# Patient Record
Sex: Male | Born: 1970 | Race: White | Hispanic: No | Marital: Married | Smoking: Never smoker
Health system: Southern US, Community
[De-identification: ages and names within clinical notes are randomized; demographics above are authoritative.]

## PROBLEM LIST (undated history)

## (undated) DIAGNOSIS — F329 Major depressive disorder, single episode, unspecified: Secondary | ICD-10-CM

## (undated) DIAGNOSIS — K219 Gastro-esophageal reflux disease without esophagitis: Secondary | ICD-10-CM

## (undated) DIAGNOSIS — Z8719 Personal history of other diseases of the digestive system: Secondary | ICD-10-CM

## (undated) DIAGNOSIS — M5126 Other intervertebral disc displacement, lumbar region: Secondary | ICD-10-CM

## (undated) DIAGNOSIS — F32A Depression, unspecified: Secondary | ICD-10-CM

## (undated) HISTORY — PX: BACK SURGERY: SHX140

---

## 1994-07-11 HISTORY — PX: CHOLECYSTECTOMY: SHX55

## 2002-07-11 HISTORY — PX: OTHER SURGICAL HISTORY: SHX169

## 2008-08-05 ENCOUNTER — Ambulatory Visit: Payer: Self-pay | Admitting: Occupational Medicine

## 2010-10-12 ENCOUNTER — Other Ambulatory Visit: Payer: Self-pay | Admitting: Specialist

## 2010-10-12 DIAGNOSIS — M5126 Other intervertebral disc displacement, lumbar region: Secondary | ICD-10-CM

## 2010-10-13 ENCOUNTER — Other Ambulatory Visit: Payer: Self-pay

## 2010-10-18 ENCOUNTER — Other Ambulatory Visit: Payer: Self-pay | Admitting: Specialist

## 2010-10-18 ENCOUNTER — Ambulatory Visit (HOSPITAL_COMMUNITY)
Admission: RE | Admit: 2010-10-18 | Discharge: 2010-10-18 | Disposition: A | Discharge status: Home / Self Care | Payer: Private Health Insurance - Indemnity | Source: Ambulatory Visit | Attending: Specialist | Admitting: Specialist

## 2010-10-18 ENCOUNTER — Other Ambulatory Visit (HOSPITAL_COMMUNITY): Payer: Self-pay | Admitting: Specialist

## 2010-10-18 ENCOUNTER — Encounter (HOSPITAL_COMMUNITY): Payer: Private Health Insurance - Indemnity

## 2010-10-18 DIAGNOSIS — Z01812 Encounter for preprocedural laboratory examination: Secondary | ICD-10-CM | POA: Insufficient documentation

## 2010-10-18 DIAGNOSIS — Z01818 Encounter for other preprocedural examination: Secondary | ICD-10-CM | POA: Insufficient documentation

## 2010-10-18 DIAGNOSIS — I498 Other specified cardiac arrhythmias: Secondary | ICD-10-CM | POA: Insufficient documentation

## 2010-10-18 DIAGNOSIS — Z01810 Encounter for preprocedural cardiovascular examination: Secondary | ICD-10-CM | POA: Insufficient documentation

## 2010-10-18 DIAGNOSIS — M47817 Spondylosis without myelopathy or radiculopathy, lumbosacral region: Secondary | ICD-10-CM | POA: Insufficient documentation

## 2010-10-18 LAB — URINALYSIS, ROUTINE W REFLEX MICROSCOPIC
Bilirubin Urine: NEGATIVE
Hgb urine dipstick: NEGATIVE
Nitrite: NEGATIVE
Protein, ur: NEGATIVE mg/dL
Urobilinogen, UA: 0.2 mg/dL (ref 0.0–1.0)

## 2010-10-18 LAB — COMPREHENSIVE METABOLIC PANEL
ALT: 253 U/L — ABNORMAL HIGH (ref 0–53)
AST: 75 U/L — ABNORMAL HIGH (ref 0–37)
Alkaline Phosphatase: 76 U/L (ref 39–117)
CO2: 33 mEq/L — ABNORMAL HIGH (ref 19–32)
Chloride: 101 mEq/L (ref 96–112)
GFR calc Af Amer: >60 mL/min (ref >60)
GFR calc non Af Amer: >60 mL/min (ref >60)
Potassium: 4.4 mEq/L (ref 3.5–5.1)
Sodium: 141 mEq/L (ref 135–145)
Total Bilirubin: 0.6 mg/dL (ref 0.3–1.2)

## 2010-10-18 LAB — CBC
HCT: 49.4 % (ref 39.0–52.0)
MCHC: 34.6 g/dL (ref 30.0–36.0)
MCV: 95 fL (ref 78.0–100.0)
Platelets: 201 10*3/uL (ref 150–400)
RDW: 13 % (ref 11.5–15.5)

## 2010-10-20 ENCOUNTER — Other Ambulatory Visit (HOSPITAL_COMMUNITY): Payer: Self-pay | Admitting: Specialist

## 2010-10-20 ENCOUNTER — Ambulatory Visit (HOSPITAL_COMMUNITY): Payer: Private Health Insurance - Indemnity

## 2010-10-20 ENCOUNTER — Observation Stay (HOSPITAL_COMMUNITY)
Admission: RE | Admit: 2010-10-20 | Discharge: 2010-10-21 | Disposition: A | Discharge status: Home / Self Care | Payer: Private Health Insurance - Indemnity | Source: Ambulatory Visit | Attending: Specialist | Admitting: Specialist

## 2010-10-20 DIAGNOSIS — Z9889 Other specified postprocedural states: Secondary | ICD-10-CM

## 2010-10-20 DIAGNOSIS — M5126 Other intervertebral disc displacement, lumbar region: Principal | ICD-10-CM | POA: Insufficient documentation

## 2010-10-20 DIAGNOSIS — M51379 Other intervertebral disc degeneration, lumbosacral region without mention of lumbar back pain or lower extremity pain: Secondary | ICD-10-CM | POA: Insufficient documentation

## 2010-10-20 DIAGNOSIS — M5137 Other intervertebral disc degeneration, lumbosacral region: Secondary | ICD-10-CM | POA: Insufficient documentation

## 2010-10-20 DIAGNOSIS — Z01811 Encounter for preprocedural respiratory examination: Secondary | ICD-10-CM | POA: Insufficient documentation

## 2010-10-20 DIAGNOSIS — Z01812 Encounter for preprocedural laboratory examination: Secondary | ICD-10-CM | POA: Insufficient documentation

## 2010-10-21 ENCOUNTER — Other Ambulatory Visit: Payer: Self-pay | Admitting: Specialist
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
## 2012-06-09 IMAGING — CR DG CHEST 2V
2 series · 2 of 2 positions shown · non-contrast
Comparison: None.

CLINICAL DATA: Preop.

CHEST - 2 VIEW

[w chest pa]
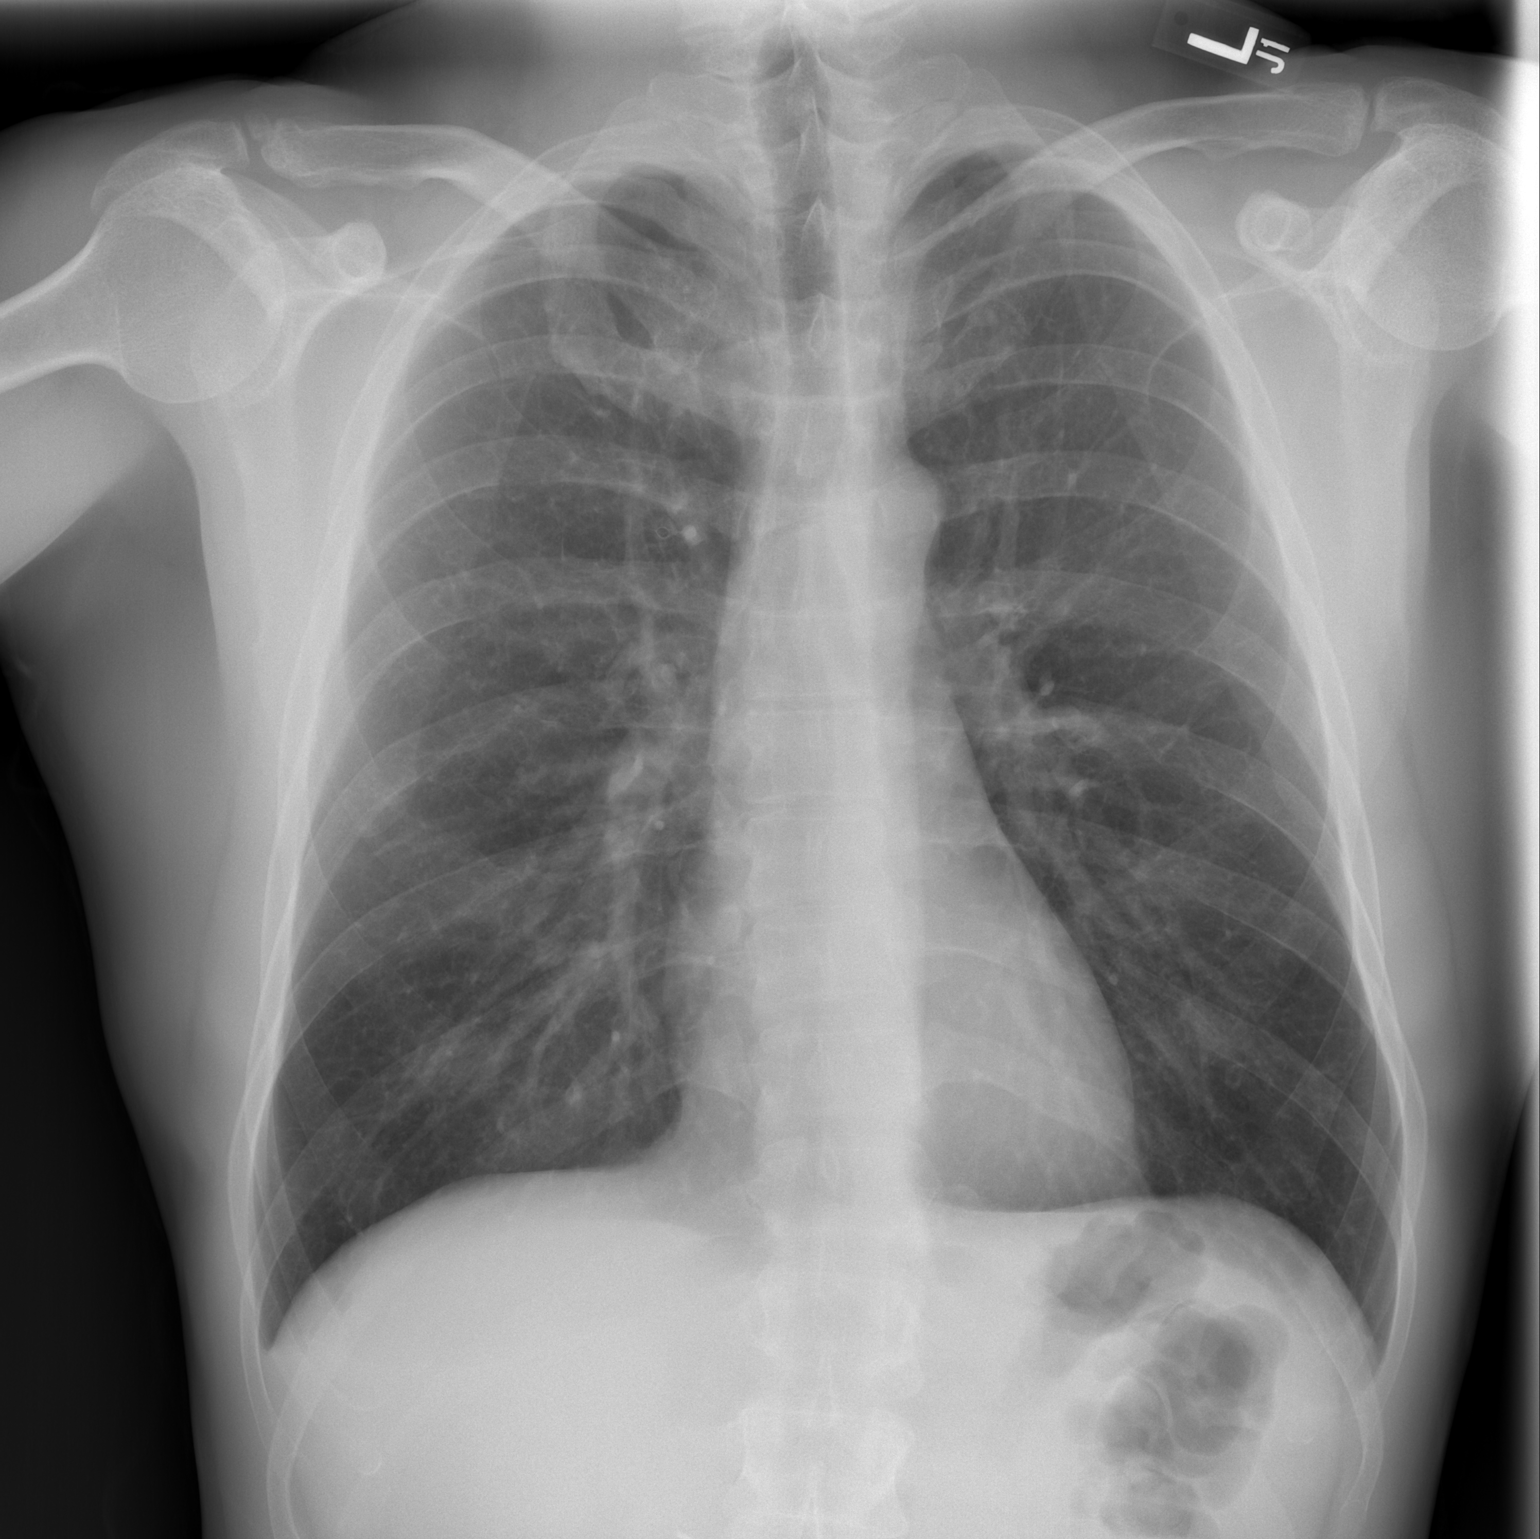

[w chest lat]
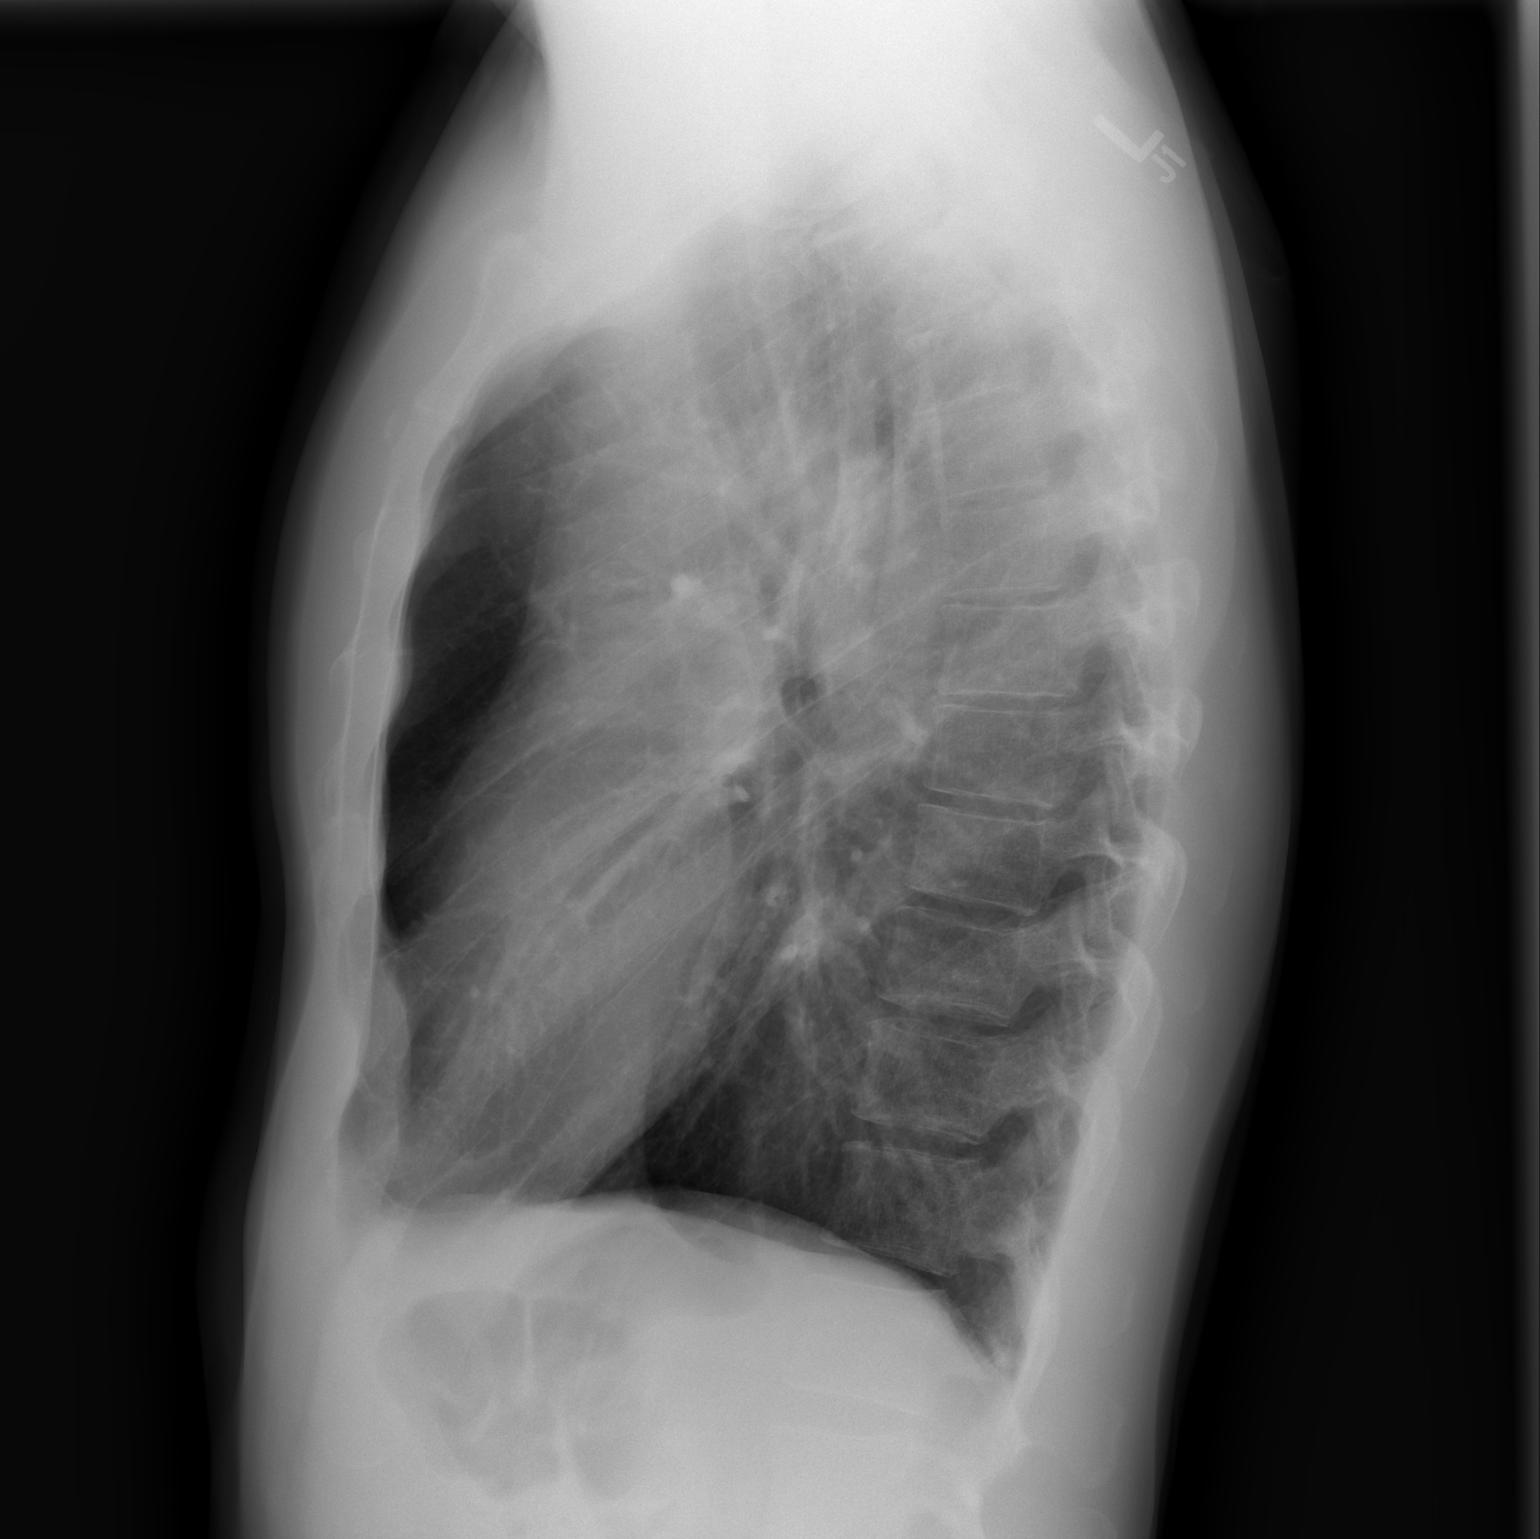

[2 of 2 positions shown; findings below may reference images not displayed]

FINDINGS: Trachea is midline.  Heart size normal. Suspect mild
flattening of the hemidiaphragms with enlargement of the
retrosternal clear space be seen with COPD.  Lungs are clear.  No
pleural fluid. Os acromion is incidentally noted on the right.
IMPRESSION: No acute findings.

## 2012-09-14 NOTE — Progress Notes (Signed)
Surgery scheduled for 09/27/12.  Preop 09/20/12 at 1430.  Need orders in EPIC.  Thanks

## 2012-09-17 ENCOUNTER — Encounter (HOSPITAL_COMMUNITY): Payer: Self-pay | Admitting: Pharmacy Technician

## 2012-09-17 ENCOUNTER — Other Ambulatory Visit: Payer: Self-pay | Admitting: Orthopedic Surgery

## 2012-09-19 ENCOUNTER — Other Ambulatory Visit (HOSPITAL_COMMUNITY): Payer: Self-pay | Admitting: *Deleted

## 2012-09-20 ENCOUNTER — Encounter (HOSPITAL_COMMUNITY): Payer: Self-pay

## 2012-09-20 ENCOUNTER — Encounter (HOSPITAL_COMMUNITY)
Admission: RE | Admit: 2012-09-20 | Discharge: 2012-09-20 | Disposition: A | Discharge status: Home / Self Care | Payer: Federal, State, Local not specified - PPO | Source: Ambulatory Visit | Attending: Specialist | Admitting: Specialist

## 2012-09-20 ENCOUNTER — Ambulatory Visit (HOSPITAL_COMMUNITY)
Admission: RE | Admit: 2012-09-20 | Discharge: 2012-09-20 | Disposition: A | Discharge status: Home / Self Care | Payer: Federal, State, Local not specified - PPO | Source: Ambulatory Visit | Attending: Orthopedic Surgery | Admitting: Orthopedic Surgery

## 2012-09-20 DIAGNOSIS — Z01812 Encounter for preprocedural laboratory examination: Secondary | ICD-10-CM | POA: Insufficient documentation

## 2012-09-20 HISTORY — DX: Gastro-esophageal reflux disease without esophagitis: K21.9

## 2012-09-20 HISTORY — DX: Major depressive disorder, single episode, unspecified: F32.9

## 2012-09-20 HISTORY — DX: Depression, unspecified: F32.A

## 2012-09-20 HISTORY — DX: Personal history of other diseases of the digestive system: Z87.19

## 2012-09-20 LAB — CBC
HCT: 42 % (ref 39.0–52.0)
Hemoglobin: 15 g/dL (ref 13.0–17.0)
MCH: 31.4 pg (ref 26.0–34.0)
MCHC: 35.7 g/dL (ref 30.0–36.0)
MCV: 87.9 fL (ref 78.0–100.0)

## 2012-09-20 LAB — BASIC METABOLIC PANEL
BUN: 14 mg/dL (ref 6–23)
CO2: 32 mEq/L (ref 19–32)
Chloride: 103 mEq/L (ref 96–112)
Glucose, Bld: 87 mg/dL (ref 70–99)
Potassium: 3.5 mEq/L (ref 3.5–5.1)

## 2012-09-20 LAB — SURGICAL PCR SCREEN: Staphylococcus aureus: NEGATIVE

## 2012-09-20 NOTE — Patient Instructions (Signed)
[name]  09/20/2012   Your procedure is scheduled on: 09-27-2012  Report to [facility] at  0930 AM.  Call this number if you have problems the morning of surgery [phone]   Remember:   Do not eat food or drink liquids :After Midnight.     Take these medicines the morning of surgery with A SIP OF WATER: sertraline                                SEE [place] PREPARING FOR SURGERY SHEET   Do not wear jewelry, make-up or nail polish.  Do not wear lotions, powders, or perfumes. You may wear deodorant.   Men may shave face and neck.  Do not bring valuables to the hospital.  Contacts, dentures or bridgework may not be worn into surgery.  Leave suitcase in the car. After surgery it may be brought to your room.  For patients admitted to the hospital, checkout time is 11:00 AM the day of discharge.   Patients discharged the day of surgery will not be allowed to drive home.  Name and phone number of your driver:  Special Instructions: N/A   Please read over the following fact sheets that you were given: MRSA Information.  Call Cain Sieve RN pre op nurse if needed [phone]    FAILURE TO FOLLOW THESE INSTRUCTIONS MAY RESULT IN THE CANCELLATION OF YOUR SURGERY. PATIENT SIGNATURE___________________________________________

## 2012-09-26 ENCOUNTER — Other Ambulatory Visit: Payer: Self-pay | Admitting: Orthopedic Surgery

## 2012-09-26 NOTE — H&P (Signed)
[name] is an 41 y.o. male.   Chief Complaint: back and right leg pain HPI: C/o severe right lower extremity radicular pain. He is having no back pain when, specifically, when divided between back and leg pain, it's only leg pain. He does have disc degeneration at 2 levels, 4-5 and 5-1. He had a disc decompression 2 years ago on the right and did well with that. He actually had an intervening MRI last year which showed minimal disc herniation to that right side. He had an injury following an intense workout. Now it has resulted in a disc herniation and leg pain. He's had an epidural without relief and remains disabled by his symptoms. We, therefore, discussed decompression and he is well versed in the risks and benefits associated with this. He would like to proceed with surgical decompression.  Past Medical History  Diagnosis Date  . Depression   . GERD (gastroesophageal reflux disease)   . H/O hiatal hernia     Past Surgical History  Procedure Laterality Date  . Cholecystectomy  1996  . Back surgery N/A     lower  . Right rotator cuff repair  2004    No family history on file. Social History:  reports that he has never smoked. He has never used smokeless tobacco. He reports that  drinks alcohol. He reports that he does not use illicit drugs.  Allergies: No Known Allergies   (Not in a hospital admission)  No results found for this or any previous visit (from the past 48 hour(s)). No results found.  Review of Systems  Constitutional: Negative.   HENT: Negative.   Eyes: Negative.   Respiratory: Negative.   Cardiovascular: Negative.   Gastrointestinal: Negative.   Genitourinary: Negative.   Musculoskeletal: Positive for back pain.  Skin: Negative.   Neurological: Positive for focal weakness.    There were no vitals taken for this visit. Physical Exam  Constitutional: He is oriented to person, place, and time. He appears well-developed and  well-nourished.  HENT:  Head: Normocephalic and atraumatic.  Eyes: Pupils are equal, round, and reactive to light.  Neck: Normal range of motion. Neck supple.  Cardiovascular: Normal rate and regular rhythm.   Respiratory: Effort normal and breath sounds normal.  GI: Bowel sounds are normal.  Musculoskeletal:  straight leg raise does produce buttock, thigh, and calf pain on the right, negative on the left. Trace EHL weakness. Tender along the lateral calf. Pain with extension.  Lumbar spine exam reveals no evidence of soft tissue swelling, no evidence of soft tissue swelling or deformity or skin ecchymosis. On palpation there is no tenderness of the lumbar spine. No flank pain with percussion. The abdomen is soft and nontender. Nontender over the trochanters. No cellulitis or lymphadenopathy.  Motor is 5/5 including tibialis anterior, plantarflexion, quadriceps and hamstrings. The patient is normoreflexic. There is no Babinski or clonus. Sensory exam is intact to light touch. The patient has good distal pulses. No DVT. No pain and normal range of motion without instability of the hips, knees and ankles.    Neurological: He is alert and oriented to person, place, and time.  Skin: Skin is warm.     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 Dr. Beane has previously discussed risks, complications, and alternatives with the patient including but not limited to DVT, PE, infx, bleeding, failure   of procedure, need for secondary procedure, dural tear, CSF leak, CRPS/RSD, anesthesia risk, even death. He desires to proceed. All questions were answered.  BISSELL, JACLYN M. 09/26/2012, 1:41 PM    

## 2012-09-27 ENCOUNTER — Observation Stay (HOSPITAL_COMMUNITY)
Admission: RE | Admit: 2012-09-27 | Discharge: 2012-09-28 | Disposition: A | Discharge status: Home / Self Care | Payer: Federal, State, Local not specified - PPO | Source: Ambulatory Visit | Attending: Specialist | Admitting: Specialist

## 2012-09-27 ENCOUNTER — Encounter (HOSPITAL_COMMUNITY): Payer: Self-pay | Admitting: Anesthesiology

## 2012-09-27 ENCOUNTER — Ambulatory Visit (HOSPITAL_COMMUNITY): Payer: Federal, State, Local not specified - PPO

## 2012-09-27 ENCOUNTER — Encounter (HOSPITAL_COMMUNITY): Payer: Self-pay | Admitting: *Deleted

## 2012-09-27 ENCOUNTER — Ambulatory Visit (HOSPITAL_COMMUNITY): Payer: Federal, State, Local not specified - PPO | Admitting: Anesthesiology

## 2012-09-27 ENCOUNTER — Encounter (HOSPITAL_COMMUNITY)
Admission: RE | Disposition: A | Discharge status: Home / Self Care | Payer: Self-pay | Source: Ambulatory Visit | Attending: Specialist

## 2012-09-27 DIAGNOSIS — K449 Diaphragmatic hernia without obstruction or gangrene: Secondary | ICD-10-CM | POA: Insufficient documentation

## 2012-09-27 DIAGNOSIS — F3289 Other specified depressive episodes: Secondary | ICD-10-CM | POA: Insufficient documentation

## 2012-09-27 DIAGNOSIS — K219 Gastro-esophageal reflux disease without esophagitis: Secondary | ICD-10-CM | POA: Insufficient documentation

## 2012-09-27 DIAGNOSIS — M5126 Other intervertebral disc displacement, lumbar region: Principal | ICD-10-CM

## 2012-09-27 HISTORY — PX: LUMBAR LAMINECTOMY/DECOMPRESSION MICRODISCECTOMY: SHX5026

## 2012-09-27 SURGERY — LUMBAR LAMINECTOMY/DECOMPRESSION MICRODISCECTOMY 1 LEVEL
Anesthesia: General | Site: Back | Wound class: Clean

## 2012-09-27 MED ORDER — METHOCARBAMOL 100 MG/ML IJ SOLN: 500.0000 mg | Freq: Four times a day (QID) | INTRAMUSCULAR | Status: DC | PRN

## 2012-09-27 MED ORDER — SUCCINYLCHOLINE CHLORIDE 20 MG/ML IJ SOLN
INTRAMUSCULAR | Status: DC | PRN
  Administered 2012-09-27: 100 mg via INTRAVENOUS

## 2012-09-27 MED ORDER — MENTHOL 3 MG MT LOZG: 1.0000 | LOZENGE | OROMUCOSAL | Status: DC | PRN

## 2012-09-27 MED ORDER — ONDANSETRON HCL 4 MG/2ML IJ SOLN
INTRAMUSCULAR | Status: DC | PRN
  Administered 2012-09-27: 4 mg via INTRAVENOUS

## 2012-09-27 MED ORDER — OXYCODONE-ACETAMINOPHEN 5-325 MG PO TABS: 1.0000 | ORAL_TABLET | ORAL | Status: DC | PRN

## 2012-09-27 MED ORDER — PHENOL 1.4 % MT LIQD: 1.0000 | OROMUCOSAL | Status: DC | PRN

## 2012-09-27 MED ORDER — CEFAZOLIN SODIUM-DEXTROSE 2-3 GM-% IV SOLR
2.0000 g | INTRAVENOUS | Status: AC
  Administered 2012-09-27: 2 g via INTRAVENOUS

## 2012-09-27 MED ORDER — SODIUM CHLORIDE 0.9 % IJ SOLN
3.0000 mL | Freq: Two times a day (BID) | INTRAMUSCULAR | Status: DC
  Administered 2012-09-27 (×2): 3 mL via INTRAVENOUS

## 2012-09-27 MED ORDER — LACTATED RINGERS IV SOLN
INTRAVENOUS | Status: DC
  Administered 2012-09-27 (×2): via INTRAVENOUS

## 2012-09-27 MED ORDER — METHOCARBAMOL 500 MG PO TABS: 500.0000 mg | ORAL_TABLET | Freq: Four times a day (QID) | ORAL | Status: DC | PRN

## 2012-09-27 MED ORDER — ACETAMINOPHEN 325 MG PO TABS: 650.0000 mg | ORAL_TABLET | ORAL | Status: DC | PRN

## 2012-09-27 MED ORDER — HYDROMORPHONE HCL PF 1 MG/ML IJ SOLN: 0.5000 mg | INTRAMUSCULAR | Status: DC | PRN

## 2012-09-27 MED ORDER — SODIUM CHLORIDE 0.9 % IR SOLN
Status: DC | PRN
  Administered 2012-09-27: 11:00:00

## 2012-09-27 MED ORDER — CEFAZOLIN SODIUM-DEXTROSE 2-3 GM-% IV SOLR
2.0000 g | Freq: Three times a day (TID) | INTRAVENOUS | Status: AC
  Administered 2012-09-27 – 2012-09-28 (×2): 2 g via INTRAVENOUS
  Filled 2012-09-27 (×2): qty 50

## 2012-09-27 MED ORDER — NEOSTIGMINE METHYLSULFATE 1 MG/ML IJ SOLN
INTRAMUSCULAR | Status: DC | PRN
  Administered 2012-09-27: 4 mg via INTRAVENOUS

## 2012-09-27 MED ORDER — BUPIVACAINE-EPINEPHRINE (PF) 0.5% -1:200000 IJ SOLN
INTRAMUSCULAR | Status: AC
  Filled 2012-09-27: qty 10

## 2012-09-27 MED ORDER — HYDROCODONE-ACETAMINOPHEN 5-325 MG PO TABS
1.0000 | ORAL_TABLET | ORAL | Status: DC | PRN
  Administered 2012-09-27: 2 via ORAL
  Filled 2012-09-27: qty 2

## 2012-09-27 MED ORDER — OXYCODONE HCL 5 MG/5ML PO SOLN: 5.0000 mg | Freq: Once | ORAL | Status: DC | PRN

## 2012-09-27 MED ORDER — DEXAMETHASONE SODIUM PHOSPHATE 10 MG/ML IJ SOLN
INTRAMUSCULAR | Status: DC | PRN
Start: 2012-09-27 — End: 2012-09-27
  Administered 2012-09-27: 10 mg via INTRAVENOUS

## 2012-09-27 MED ORDER — PROPOFOL 10 MG/ML IV BOLUS
INTRAVENOUS | Status: DC | PRN
  Administered 2012-09-27: 150 mg via INTRAVENOUS

## 2012-09-27 MED ORDER — SODIUM CHLORIDE 0.9 % IJ SOLN: 3.0000 mL | INTRAMUSCULAR | Status: DC | PRN

## 2012-09-27 MED ORDER — THROMBIN 5000 UNITS EX SOLR
OROMUCOSAL | Status: DC | PRN
  Administered 2012-09-27: 11:00:00 via TOPICAL

## 2012-09-27 MED ORDER — OXYCODONE HCL 5 MG PO TABS: 5.0000 mg | ORAL_TABLET | Freq: Once | ORAL | Status: DC | PRN

## 2012-09-27 MED ORDER — SODIUM CHLORIDE 0.9 % IV SOLN: 250.0000 mL | INTRAVENOUS | Status: DC

## 2012-09-27 MED ORDER — KCL IN DEXTROSE-NACL 20-5-0.45 MEQ/L-%-% IV SOLN
INTRAVENOUS | Status: DC
  Administered 2012-09-27: 15:00:00 via INTRAVENOUS
  Filled 2012-09-27 (×2): qty 1000

## 2012-09-27 MED ORDER — THROMBIN 5000 UNITS EX SOLR
CUTANEOUS | Status: AC
  Filled 2012-09-27: qty 10000

## 2012-09-27 MED ORDER — KETOROLAC TROMETHAMINE 30 MG/ML IJ SOLN
INTRAMUSCULAR | Status: DC | PRN
  Administered 2012-09-27: 30 mg via INTRAVENOUS

## 2012-09-27 MED ORDER — ACETAMINOPHEN 650 MG RE SUPP: 650.0000 mg | RECTAL | Status: DC | PRN

## 2012-09-27 MED ORDER — ROCURONIUM BROMIDE 100 MG/10ML IV SOLN
INTRAVENOUS | Status: DC | PRN
  Administered 2012-09-27: 20 mg via INTRAVENOUS

## 2012-09-27 MED ORDER — ACETAMINOPHEN 10 MG/ML IV SOLN
INTRAVENOUS | Status: DC | PRN
  Administered 2012-09-27: 1000 mg via INTRAVENOUS

## 2012-09-27 MED ORDER — BUPIVACAINE-EPINEPHRINE 0.5% -1:200000 IJ SOLN
INTRAMUSCULAR | Status: DC | PRN
  Administered 2012-09-27: 7 mL

## 2012-09-27 MED ORDER — MEPERIDINE HCL 50 MG/ML IJ SOLN: 6.2500 mg | INTRAMUSCULAR | Status: DC | PRN

## 2012-09-27 MED ORDER — ONDANSETRON HCL 4 MG/2ML IJ SOLN: 4.0000 mg | INTRAMUSCULAR | Status: DC | PRN

## 2012-09-27 MED ORDER — METHOCARBAMOL 500 MG PO TABS: 500.0000 mg | ORAL_TABLET | Freq: Four times a day (QID) | ORAL | Status: DC

## 2012-09-27 MED ORDER — SERTRALINE HCL 100 MG PO TABS
100.0000 mg | ORAL_TABLET | Freq: Every morning | ORAL | Status: DC
  Filled 2012-09-27: qty 1

## 2012-09-27 MED ORDER — FENTANYL CITRATE 0.05 MG/ML IJ SOLN
INTRAMUSCULAR | Status: DC | PRN
  Administered 2012-09-27 (×2): 25 ug via INTRAVENOUS
  Administered 2012-09-27 (×2): 50 ug via INTRAVENOUS
  Administered 2012-09-27: 100 ug via INTRAVENOUS

## 2012-09-27 MED ORDER — ACETAMINOPHEN 10 MG/ML IV SOLN: 1000.0000 mg | Freq: Once | INTRAVENOUS | Status: DC | PRN

## 2012-09-27 MED ORDER — DOCUSATE SODIUM 100 MG PO CAPS
100.0000 mg | ORAL_CAPSULE | Freq: Two times a day (BID) | ORAL | Status: DC
  Administered 2012-09-27: 100 mg via ORAL
  Filled 2012-09-27 (×3): qty 1

## 2012-09-27 MED ORDER — GLYCOPYRROLATE 0.2 MG/ML IJ SOLN
INTRAMUSCULAR | Status: DC | PRN
  Administered 2012-09-27: .6 mg via INTRAVENOUS

## 2012-09-27 MED ORDER — HYDROMORPHONE HCL PF 1 MG/ML IJ SOLN: 0.2500 mg | INTRAMUSCULAR | Status: DC | PRN

## 2012-09-27 MED ORDER — PROMETHAZINE HCL 25 MG/ML IJ SOLN: 6.2500 mg | INTRAMUSCULAR | Status: DC | PRN

## 2012-09-27 MED ORDER — MIDAZOLAM HCL 5 MG/5ML IJ SOLN
INTRAMUSCULAR | Status: DC | PRN
  Administered 2012-09-27: 2 mg via INTRAVENOUS

## 2012-09-27 SURGICAL SUPPLY — 46 items
BAG ZIPLOCK 12X15 (MISCELLANEOUS) ×2 IMPLANT
BENZOIN TINCTURE PRP APPL 2/3 (GAUZE/BANDAGES/DRESSINGS) ×2 IMPLANT
CLEANER TIP ELECTROSURG 2X2 (MISCELLANEOUS) ×2 IMPLANT
CLOTH BEACON ORANGE TIMEOUT ST (SAFETY) ×2 IMPLANT
CLSR STERI-STRIP ANTIMIC 1/2X4 (GAUZE/BANDAGES/DRESSINGS) ×2 IMPLANT
DECANTER SPIKE VIAL GLASS SM (MISCELLANEOUS) IMPLANT
DRAPE MICROSCOPE LEICA (MISCELLANEOUS) ×2 IMPLANT
DRAPE POUCH INSTRU U-SHP 10X18 (DRAPES) ×2 IMPLANT
DRAPE SURG 17X11 SM STRL (DRAPES) ×2 IMPLANT
DRSG AQUACEL AG ADV 3.5X 4 (GAUZE/BANDAGES/DRESSINGS) ×2 IMPLANT
DURAPREP 26ML APPLICATOR (WOUND CARE) ×2 IMPLANT
ELECT REM PT RETURN 9FT ADLT (ELECTROSURGICAL) ×2
ELECTRODE REM PT RTRN 9FT ADLT (ELECTROSURGICAL) ×1 IMPLANT
GLOVE BIOGEL PI IND STRL 7.5 (GLOVE) ×1 IMPLANT
GLOVE BIOGEL PI IND STRL 8 (GLOVE) IMPLANT
GLOVE BIOGEL PI INDICATOR 7.5 (GLOVE) ×1
GLOVE BIOGEL PI INDICATOR 8 (GLOVE)
GLOVE SURG SS PI 7.5 STRL IVOR (GLOVE) ×2 IMPLANT
GLOVE SURG SS PI 8.0 STRL IVOR (GLOVE) ×4 IMPLANT
GOWN PREVENTION PLUS LG XLONG (DISPOSABLE) ×2 IMPLANT
GOWN STRL REIN XL XLG (GOWN DISPOSABLE) ×4 IMPLANT
IV CATH 14GX2 1/4 (CATHETERS) ×2 IMPLANT
KIT BASIN OR (CUSTOM PROCEDURE TRAY) ×2 IMPLANT
KIT POSITIONING SURG ANDREWS (MISCELLANEOUS) ×2 IMPLANT
MANIFOLD NEPTUNE II (INSTRUMENTS) ×2 IMPLANT
NEEDLE SPNL 18GX3.5 QUINCKE PK (NEEDLE) IMPLANT
PATTIES SURGICAL .5 X.5 (GAUZE/BANDAGES/DRESSINGS) IMPLANT
PATTIES SURGICAL .75X.75 (GAUZE/BANDAGES/DRESSINGS) IMPLANT
PATTIES SURGICAL 1X1 (DISPOSABLE) IMPLANT
SPONGE SURGIFOAM ABS GEL 100 (HEMOSTASIS) ×2 IMPLANT
STAPLER VISISTAT (STAPLE) IMPLANT
STRIP CLOSURE SKIN 1/2X4 (GAUZE/BANDAGES/DRESSINGS) IMPLANT
SUT PROLENE 3 0 PS 2 (SUTURE) ×2 IMPLANT
SUT VIC AB 0 CT1 27 (SUTURE)
SUT VIC AB 0 CT1 27XBRD ANTBC (SUTURE) IMPLANT
SUT VIC AB 1 CT1 27 (SUTURE) ×1
SUT VIC AB 1 CT1 27XBRD ANTBC (SUTURE) ×1 IMPLANT
SUT VIC AB 1-0 CT2 27 (SUTURE) ×2 IMPLANT
SUT VIC AB 2-0 CT1 27 (SUTURE) ×2
SUT VIC AB 2-0 CT1 TAPERPNT 27 (SUTURE) ×2 IMPLANT
SUT VIC AB 2-0 CT2 27 (SUTURE) IMPLANT
SUT VICRYL 0 27 CT2 27 ABS (SUTURE) IMPLANT
SUT VICRYL 0 UR6 27IN ABS (SUTURE) IMPLANT
SYRINGE 10CC LL (SYRINGE) ×2 IMPLANT
TRAY LAMINECTOMY (CUSTOM PROCEDURE TRAY) ×2 IMPLANT
YANKAUER SUCT BULB TIP NO VENT (SUCTIONS) ×2 IMPLANT

## 2012-09-27 NOTE — Transfer of Care (Signed)
Immediate Anesthesia Transfer of Care Note  Patient: [name]  Procedure(s) Performed: Procedure(s): REDO LUMBAR LAMINECTOMY/DECOMPRESSION MICRODISCECTOMY 1 LEVEL (N/A)  Patient Location: PACU  Anesthesia Type:General  Level of Consciousness: awake, alert , oriented and patient cooperative  Airway & Oxygen Therapy: Patient Spontanous Breathing and Patient connected to face mask oxygen  Post-op Assessment: Report given to PACU RN and Post -op Vital signs reviewed and stable  Post vital signs: Reviewed and stable  Complications: No apparent anesthesia complications

## 2012-09-27 NOTE — Anesthesia Preprocedure Evaluation (Addendum)
Anesthesia Evaluation  Patient identified by MRN, date of birth, ID band Patient awake    Reviewed: Allergy & Precautions, H&P , NPO status , Patient's Chart, lab work & pertinent test results  Airway Mallampati: I TM Distance: >3 FB Neck ROM: Full    Dental  (+) Dental Advisory Given and Teeth Intact   Pulmonary neg pulmonary ROS,  breath sounds clear to auscultation  Pulmonary exam normal       Cardiovascular negative cardio ROS  Rhythm:Regular Rate:Normal     Neuro/Psych PSYCHIATRIC DISORDERS Depression negative neurological ROS     GI/Hepatic Neg liver ROS, hiatal hernia, GERD-  Medicated,  Endo/Other  negative endocrine ROS  Renal/GU negative Renal ROS  negative genitourinary   Musculoskeletal negative musculoskeletal ROS (+)   Abdominal   Peds negative pediatric ROS (+)  Hematology negative hematology ROS (+)   Anesthesia Other Findings   Reproductive/Obstetrics                          Anesthesia Physical Anesthesia Plan  ASA: II  Anesthesia Plan: General   Post-op Pain Management:    Induction: Intravenous  Airway Management Planned: Oral ETT  Additional Equipment:   Intra-op Plan:   Post-operative Plan: Extubation in OR  Informed Consent: I have reviewed the patients History and Physical, chart, labs and discussed the procedure including the risks, benefits and alternatives for the proposed anesthesia with the patient or authorized representative who has indicated his/her understanding and acceptance.   Dental advisory given  Plan Discussed with: CRNA  Anesthesia Plan Comments:         Anesthesia Quick Evaluation

## 2012-09-27 NOTE — Anesthesia Postprocedure Evaluation (Signed)
Anesthesia Post Note  Patient: [name]  Procedure(s) Performed: Procedure(s) (LRB): REDO LUMBAR LAMINECTOMY/DECOMPRESSION MICRODISCECTOMY 1 LEVEL (N/A)  Anesthesia type: General  Patient location: PACU  Post pain: Pain level controlled  Post assessment: Post-op Vital signs reviewed  Last Vitals: BP 130/54  Pulse 64  Temp(Src) 36.8 C  Resp 11  SpO2 100%  Post vital signs: Reviewed  Level of consciousness: sedated  Complications: No apparent anesthesia complications

## 2012-09-27 NOTE — Preoperative (Signed)
Beta Blockers   Reason not to administer Beta Blockers:Not Applicable 

## 2012-09-27 NOTE — Brief Op Note (Signed)
09/27/2012  12:06 PM  PATIENT:  [name]  42 y.o. male  PRE-OPERATIVE DIAGNOSIS:  herniated nucleus pulposis L4-5  POST-OPERATIVE DIAGNOSIS:  herniated nucleus pulposis L4-5  PROCEDURE:  Procedure(s): REDO LUMBAR LAMINECTOMY/DECOMPRESSION MICRODISCECTOMY 1 LEVEL (N/A)  SURGEON:  Surgeon(s) and Role:    * Javier Docker, MD - Primary  PHYSICIAN ASSISTANT:   ASSISTANTS: Bissell   ANESTHESIA:   general  EBL:  Total I/O In: 1000 [I.V.:1000] Out: 50 [Blood:50]  BLOOD ADMINISTERED:none  DRAINS: none   LOCAL MEDICATIONS USED:  MARCAINE     SPECIMEN:  Source of Specimen:  L45  DISPOSITION OF SPECIMEN:  PATHOLOGY  COUNTS:  YES  TOURNIQUET:  * No tourniquets in log *  DICTATION: .Other Dictation: Dictation Number [id number]  PLAN OF CARE: Admit for overnight observation  PATIENT DISPOSITION:  PACU - hemodynamically stable.   Delay start of Pharmacological VTE agent (>24hrs) due to surgical blood loss or risk of bleeding: yes

## 2012-09-27 NOTE — Interval H&P Note (Signed)
History and Physical Interval Note:  09/27/2012 7:10 AM  [name]  has presented today for surgery, with the diagnosis of HNP L4-5  The various methods of treatment have been discussed with the patient and family. After consideration of risks, benefits and other options for treatment, the patient has consented to  Procedure(s) with comments: REDO LUMBAR LAMINECTOMY/DECOMPRESSION MICRODISCECTOMY 1 LEVEL (N/A) - REDO LUMBAR DECOMPRESSION L4-5 as a surgical intervention .  The patient's history has been reviewed, patient examined, no change in status, stable for surgery.  I have reviewed the patient's chart and labs.  Questions were answered to the patient's satisfaction.     Shuree Brossart C

## 2012-09-27 NOTE — H&P (View-Only) (Signed)
[name] is an 42 y.o. male.   Chief Complaint: back and right leg pain HPI: C/o severe right lower extremity radicular pain. He is having no back pain when, specifically, when divided between back and leg pain, it's only leg pain. He does have disc degeneration at 2 levels, 4-5 and 5-1. He had a disc decompression 2 years ago on the right and did well with that. He actually had an intervening MRI last year which showed minimal disc herniation to that right side. He had an injury following an intense workout. Now it has resulted in a disc herniation and leg pain. He's had an epidural without relief and remains disabled by his symptoms. We, therefore, discussed decompression and he is well versed in the risks and benefits associated with this. He would like to proceed with surgical decompression.  Past Medical History  Diagnosis Date  . Depression   . GERD (gastroesophageal reflux disease)   . H/O hiatal hernia     Past Surgical History  Procedure Laterality Date  . Cholecystectomy  1996  . Back surgery N/A     lower  . Right rotator cuff repair  2004    No family history on file. Social History:  reports that he has never smoked. He has never used smokeless tobacco. He reports that  drinks alcohol. He reports that he does not use illicit drugs.  Allergies: No Known Allergies   (Not in a hospital admission)  No results found for this or any previous visit (from the past 48 hour(s)). No results found.  Review of Systems  Constitutional: Negative.   HENT: Negative.   Eyes: Negative.   Respiratory: Negative.   Cardiovascular: Negative.   Gastrointestinal: Negative.   Genitourinary: Negative.   Musculoskeletal: Positive for back pain.  Skin: Negative.   Neurological: Positive for focal weakness.    There were no vitals taken for this visit. Physical Exam  Constitutional: He is oriented to person, place, and time. He appears well-developed and  well-nourished.  HENT:  Head: Normocephalic and atraumatic.  Eyes: Pupils are equal, round, and reactive to light.  Neck: Normal range of motion. Neck supple.  Cardiovascular: Normal rate and regular rhythm.   Respiratory: Effort normal and breath sounds normal.  GI: Bowel sounds are normal.  Musculoskeletal:  straight leg raise does produce buttock, thigh, and calf pain on the right, negative on the left. Trace EHL weakness. Tender along the lateral calf. Pain with extension.  Lumbar spine exam reveals no evidence of soft tissue swelling, no evidence of soft tissue swelling or deformity or skin ecchymosis. On palpation there is no tenderness of the lumbar spine. No flank pain with percussion. The abdomen is soft and nontender. Nontender over the trochanters. No cellulitis or lymphadenopathy.  Motor is 5/5 including tibialis anterior, plantarflexion, quadriceps and hamstrings. The patient is normoreflexic. There is no Babinski or clonus. Sensory exam is intact to light touch. The patient has good distal pulses. No DVT. No pain and normal range of motion without instability of the hips, knees and ankles.    Neurological: He is alert and oriented to person, place, and time.  Skin: Skin is warm.     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 Dr. Shelle Iron has previously discussed risks, complications, and alternatives with the patient including but not limited to DVT, PE, infx, bleeding, failure  of procedure, need for secondary procedure, dural tear, CSF leak, CRPS/RSD, anesthesia risk, even death. He desires to proceed. All questions were answered.  [name] M. 09/26/2012, 1:41 PM

## 2012-09-28 ENCOUNTER — Encounter (HOSPITAL_COMMUNITY): Payer: Self-pay | Admitting: Specialist

## 2012-09-28 NOTE — Progress Notes (Signed)
Subjective: 1 Day Post-Op Procedure(s) (LRB): REDO LUMBAR LAMINECTOMY/DECOMPRESSION MICRODISCECTOMY 1 LEVEL (N/A) Patient reports pain as mild.  Notes minimal back pain. Leg pain immediately resolved post-op. No leg pain, numbness, tingling, bowel/bladder incontinence. Has been OOB several times without difficulty. Feels ready for D/C home.  Objective: Vital signs in last 24 hours: Temp:  [97.7 F (36.5 C)-98.3 F (36.8 C)] 97.7 F (36.5 C) (03/21 0536) Pulse Rate:  [63-82] 63 (03/21 0536) Resp:  [11-20] 14 (03/21 0536) BP: (107-151)/(54-78) 109/65 mmHg (03/21 0536) SpO2:  [93 %-100 %] 93 % (03/21 0536) Weight:  [79.52 kg (175 lb 5 oz)] 79.52 kg (175 lb 5 oz) (03/20 1345)  Intake/Output from previous day: 03/20 0701 - 03/21 0700 In: 1743 [I.V.:1743] Out: 1625 [Urine:1575; Blood:50] Intake/Output this shift:    No results found for this basename: HGB,  in the last 72 hours No results found for this basename: WBC, RBC, HCT, PLT,  in the last 72 hours No results found for this basename: NA, K, CL, CO2, BUN, CREATININE, GLUCOSE, CALCIUM,  in the last 72 hours No results found for this basename: LABPT, INR,  in the last 72 hours  Neurologically intact Neurovascular intact Sensation intact distally Intact pulses distally Dorsiflexion/Plantar flexion intact Incision: dressing C/D/I and no drainage No cellulitis present No calf pain, negative homan's, no sign of DVT  Assessment/Plan: 1 Day Post-Op Procedure(s) (LRB): REDO LUMBAR LAMINECTOMY/DECOMPRESSION MICRODISCECTOMY 1 LEVEL (N/A) Advance diet Up with therapy D/C IV fluids D/C home Reviewed D/C instructions Will discuss with Dr. Elissa Lovett, Dayna Barker. 09/28/2012, 7:03 AM

## 2012-09-28 NOTE — Care Management Note (Signed)
    Page 1 of 1   09/28/2012     2:29:16 PM   CARE MANAGEMENT NOTE 09/28/2012  Patient:  [name], [name]   Account Number:  [id number]  Date Initiated:  09/28/2012  Documentation initiated by:  Lanier Clam  Subjective/Objective Assessment:   ADMITTED W/BACK,R LEG PAIN     Action/Plan:   FROM HOME   Anticipated DC Date:  09/28/2012   Anticipated DC Plan:  HOME/SELF CARE      DC Planning Services  CM consult      Choice offered to / List presented to:             Status of service:  Completed, signed off Medicare Important Message given?   (If response is "NO", the following Medicare IM given date fields will be blank) Date Medicare IM given:   Date Additional Medicare IM given:    Discharge Disposition:  HOME/SELF CARE  Per UR Regulation:  Reviewed for med. necessity/level of care/duration of stay  If discussed at Long Length of Stay Meetings, dates discussed:    Comments:  09/28/12 Lundy Cozart RN,BSN NCM [phone] S/P HNP,LUMBAR DECOMPRESSION.Marland KitchenPT/OT-N/A. NO D/C NEEDS OR ORDERS.

## 2012-09-28 NOTE — Evaluation (Signed)
Occupational Therapy Evaluation Patient Details Name: [name] MRN: [id number] DOB: 1971-07-03 Today's Date: 09/28/2012 Time: 4098-1191 OT Time Calculation (min): 11 min  OT Assessment / Plan / Recommendation Clinical Impression  This 42 year old man was admitted for redo L4-5 decompression.  Last back sx was 2 years ago.  Reviewed back precautions with adls, bed moblity and transfers.  No further OT is needed.      OT Assessment  Patient does not need any further OT services    Follow Up Recommendations  No OT follow up    Barriers to Discharge      Equipment Recommendations  None recommended by OT    Recommendations for Other Services    Frequency       Precautions / Restrictions Precautions Precautions: Back Precaution Booklet Issued: Yes (comment) Restrictions Weight Bearing Restrictions: No   Pertinent Vitals/Pain No pain reported    ADL  Lower Body Dressing: Performed;Minimal assistance (pants and shirt:  independent; min for socks) Toilet Transfer: Simulated;Independent Toilet Transfer Method: Sit to Barista: Comfort height toilet Transfers/Ambulation Related to ADLs: independent walking to bathroom ADL Comments: Reviewed back precautions with adls.  Pt mostly independent:  can cross LLE but min pulling:  educated to have assist with this initially.  Also reviewed alternative positions.  Educated to sit backwards on toilet if it is too low. (commode is handicapped but pt not sure how high)  Pt/wife did not have any questions.      OT Diagnosis:    OT Problem List:   OT Treatment Interventions:     OT Goals    Visit Information  Last OT Received On: 09/28/12 Assistance Needed: +1    Subjective Data  Subjective: I'm good.  I'm ready to leave Patient Stated Goal: home today   Prior Functioning     Home Living Lives With: Spouse Available Help at Discharge: Family Type of Home: House Home Access: Stairs to  enter Secretary/administrator of Steps: 2 Home Layout: One level Bathroom Shower/Tub: Health visitor: Handicapped height Additional Comments: shower bench in shower Prior Function Level of Independence: Independent Communication Communication: No difficulties         Vision/Perception     Cognition  Cognition Overall Cognitive Status: Appears within functional limits for tasks assessed/performed Arousal/Alertness: Awake/alert Orientation Level: Oriented X4 / Intact Behavior During Session: [facility] for tasks performed    Extremity/Trunk Assessment Right Upper Extremity Assessment RUE ROM/Strength/Tone: [facility] for tasks assessed Left Upper Extremity Assessment LUE ROM/Strength/Tone: WFL for tasks assessed Right Lower Extremity Assessment RLE ROM/Strength/Tone: [facility] for tasks assessed     Mobility Bed Mobility Bed Mobility: Rolling Left;Left Sidelying to Sit;Sit to Sidelying Left Rolling Left: 6: Modified independent (Device/Increase time) Left Sidelying to Sit: 5: Supervision (performed twice:  second time independent) Sit to Sidelying Left: 7: Independent Details for Bed Mobility Assistance: needed cues initially as pt moved quickly and began to twist shoulders a little Transfers Transfers: Sit to Stand Sit to Stand: 7: Independent     Exercise     Balance     End of Session OT - End of Session Activity Tolerance: Patient tolerated treatment well Patient left: with family/visitor present  GO Functional Assessment Tool Used: clinical observation Functional Limitation: Self care Self Care Current Status (Y7829): At least 1 percent but less than 20 percent impaired, limited or restricted Self Care Goal Status (F6213): At least 1 percent but less than 20 percent impaired, limited or restricted  Self Care Discharge Status [phone]): At least 1 percent but less than 20 percent impaired, limited or restricted   [facility] 09/28/2012, 8:21 AM Marica Otter, OTR/L [phone] 09/28/2012

## 2012-09-28 NOTE — Progress Notes (Signed)
CSW consulted for sNF placement. PN reviewed. Pt will be d/c home following hospital d/c. RNCM will assist with d/c planning as needed.  Cori Razor LCSW [phone]

## 2012-09-28 NOTE — Op Note (Signed)
[name], Stafford         ACCOUNT NO.:  [id number]  MEDICAL RECORD NO.:  [id number]  LOCATION:  1533                         FACILITY:  [facility]  PHYSICIAN:  Jene Every, M.D.    DATE OF BIRTH:  02-26-1971  DATE OF PROCEDURE:  09/27/2012 DATE OF DISCHARGE:                              OPERATIVE REPORT   PREOPERATIVE DIAGNOSIS:  Recurrent disk herniation, spinal stenosis, L4- 5 right.  POSTOPERATIVE DIAGNOSIS:  Recurrent disk herniation, spinal stenosis, L4- 5 right.  PROCEDURE PERFORMED: 1. Redo lumbar decompression L4-5 with microdiscectomy. 2. Foraminotomies of L4 and L5.  ANESTHESIA:  General.  ASSISTANT:  Lanna Poche, PA, was utilized for general intermittent neural traction helped with positioning, suctioning.  SPECIMEN:  L4-5 disk herniation.  HISTORY:  This is a 42 year old who had done well in the past with a diskectomy, had no back pain, but recurrent severe leg pain despite rest, activity modification, epidural.  He had neural tension signs, EHL weakness, therefore was indicated for decompression, to prevent further neurologic deficit and to allow nerve root recovery.  Risk and benefits were discussed including bleeding, infection, damage to neurovascular structure, DVT, PE, CSF leakage, anesthetic complications, need for fusion in future, etc.  TECHNIQUE:  With the patient in supine position, after induction of adequate general anesthesia, 2 g Kefzol, placed prone on the [facility] [place] frame.  All bony prominences well padded.  Lumbar region was prepped and draped in usual sterile fashion.  Surgical incision was utilized utilizing the previous incision over 4-5 excising the previous scar. Subcutaneous tissue was dissected, electrocautery was utilized to achieve hemostasis.  Scar tissue was encountered consistent with previous surgery.  McCullough retractors were placed.  Operating microscope was draped along the surgical field after  confirmatory radiograph obtained confirmed 4-5 disk.  Next, we skeletonized the previous laminotomy with a straight curette and large laminotomy with hemilaminotomy of the caudal edge of 4 with a 2 mm Kerrison preserving the pars.  In a similar fashion, the superior articulating process of 5 was noted.  After mobilizing the scar tissue meticulously down the foramen of 5 and a large foraminotomy identified the 5 root and decompressed and there was some foraminal stenosis noted.  Then identified the pedicle of 4 and performed a foraminotomy of 4, there was some stenosis noted here as well.  Stand on the superior medial aspect of the pedicle and protecting the thecal sac in the neural elements.  We identified a large disk herniation, extruded fragment, this was mobilized with a nerve hook and a micropituitary.  Four large fragments were removed and extended down into the disk space of 4-5 which was encountered.  I removed the fragments from the disk space and obtained a confirmatory radiograph there confirming the disk space.  After sweeping beneath the thecal sac and foramen of 5, the axilla and shoulder of the root, there was no further residual compression upon the nerve root of 4- 5.  A 1-cm excursion of the 5 root median of the pedicle was noted.  It was good.  No evidence of CSF leakage or active bleeding was noted.  A hockey-stick probe passed freely up the foramen of 4 and 5.  Disk space was copiously irrigated with  antibiotic irrigation via catheter lavage. Inspection revealed no CSF leakage or active bleeding and placed thrombin-soaked Gelfoam in the laminotomy defect, removed the McCullough retractor, and paraspinous muscles inspected.  No evidence of active bleeding.  We irrigated tissues, closed the dorsal lumbar fascia with 1 Vicryl, subcu with 2-0 Vicryl, skin reapproximated with 4-0 subcuticular Prolene.  Wound reinforced with Steri-Strips, sterile dressing applied, placed  supine on the hospital bed, extubated without difficulty, and transported to the recovery room in satisfactory condition.  The patient tolerated the procedure well.  No complications.  Minimal blood loss.     Jene Every, M.D.     Cordelia Pen  D:  09/27/2012  T:  09/28/2012  Job:  [id number]

## 2012-09-28 NOTE — Progress Notes (Signed)
PT NOTE  Order received. Chart reviewed. Spoke briefly with pt who was up in room unassisted without difficulty. Pt able to recall and verbalize back precautions and logroll technique. PT Screen only. No PT needs at this time. Also noted that pt was Modified Independent-Independent with OT this am. No follow up needs or equipment. Thanks. Rebeca Alert, PT [phone]

## 2012-09-28 NOTE — Discharge Summary (Signed)
Physician Discharge Summary   Patient ID: [name] MRN: [id number] DOB/AGE: Oct 21, 1970 42 y.o.  Admit date: 09/27/2012 Discharge date: 09/28/2012  Primary Diagnosis:   herniated nucleus pulposis L4-5  Admission Diagnoses:  Past Medical History  Diagnosis Date  . Depression   . GERD (gastroesophageal reflux disease)   . H/O hiatal hernia    Discharge Diagnoses:   Principal Problem:   HNP (herniated nucleus pulposus), lumbar  Procedure:  Procedure(s) (LRB): REDO LUMBAR LAMINECTOMY/DECOMPRESSION MICRODISCECTOMY 1 LEVEL (N/A)   Consults: None  HPI:  see H&P    Laboratory Data: Hospital Outpatient Visit on 09/20/2012  Component Date Value Range Status  . Sodium 09/20/2012 143  135 - 145 mEq/L Final  . Potassium 09/20/2012 3.5  3.5 - 5.1 mEq/L Final  . Chloride 09/20/2012 103  96 - 112 mEq/L Final  . CO2 09/20/2012 32  19 - 32 mEq/L Final  . Glucose, Bld 09/20/2012 87  70 - 99 mg/dL Final  . BUN 86/57/8469 14  6 - 23 mg/dL Final  . Creatinine, Ser 09/20/2012 0.94  0.50 - 1.35 mg/dL Final  . Calcium 62/95/2841 9.3  8.4 - 10.5 mg/dL Final  . GFR calc non Af Amer 09/20/2012 >90  >90 mL/min Final  . GFR calc Af Amer 09/20/2012 >90  >90 mL/min Final   Comment:                                 The eGFR has been calculated                          using the CKD EPI equation.                          This calculation has not been                          validated in all clinical                          situations.                          eGFR's persistently                          <90 mL/min signify                          possible Chronic Kidney Disease.  . WBC 09/20/2012 7.4  4.0 - 10.5 K/uL Final  . RBC 09/20/2012 4.78  4.22 - 5.81 MIL/uL Final  . Hemoglobin 09/20/2012 15.0  13.0 - 17.0 g/dL Final  . HCT 32/44/0102 42.0  39.0 - 52.0 % Final  . MCV 09/20/2012 87.9  78.0 - 100.0 fL Final  . MCH 09/20/2012 31.4  26.0 - 34.0 pg Final  . MCHC 09/20/2012  35.7  30.0 - 36.0 g/dL Final  . RDW 72/53/6644 12.7  11.5 - 15.5 % Final  . Platelets 09/20/2012 235  150 - 400 K/uL Final  . MRSA, PCR 09/20/2012 NEGATIVE  NEGATIVE Final  . Staphylococcus aureus 09/20/2012 NEGATIVE  NEGATIVE Final   Comment:  The Xpert SA Assay (FDA                          approved for NASAL specimens                          in patients over 52 years of age),                          is one component of                          a comprehensive surveillance                          program.  Test performance has                          been validated by [employer] for patients greater                          than or equal to 48 year old.                          It is not intended                          to diagnose infection nor to                          guide or monitor treatment.   No results found for this basename: HGB,  in the last 72 hours No results found for this basename: WBC, RBC, HCT, PLT,  in the last 72 hours No results found for this basename: NA, K, CL, CO2, BUN, CREATININE, GLUCOSE, CALCIUM,  in the last 72 hours No results found for this basename: LABPT, INR,  in the last 72 hours  X-Rays:Dg Lumbar Spine 2-3 Views  09/20/2012  *RADIOLOGY REPORT*  Clinical Data: Preoperative evaluation prior lumbar laminectomy.  LUMBAR SPINE - 2-3 VIEW  Comparison: 10/20/2010.  Findings: AP and lateral views of the lumbar spine demonstrate no acute displaced fractures or compression type fractures.  Alignment is anatomic.  Mild multilevel degenerative disc disease is noted, most severe at L4-L5 and L5-S1.  Multilevel facet arthropathy is also noted and most severe at L5-S1.  IMPRESSION: 1.  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   Original Report Authenticated By: Trudie Reed, M.D.    Dg Spine Portable 1 View  09/27/2012  *RADIOLOGY REPORT*  Clinical Data:  Intraoperative localization for lumbar spine surgery.  PORTABLE SPINE - 1 VIEW  Comparison: Earlier film, same date.  Findings: The surgical instrument is marking the L4-5 disc space level.  IMPRESSION: L4-5 marked intraoperatively.   Original Report Authenticated By: Rudie Meyer, M.D.    Dg Spine Portable 1 View  09/27/2012  *RADIOLOGY REPORT*  Clinical Data: Intraoperative localization for lumbar spine surgery.  PORTABLE SPINE - 1 VIEW  Comparison: Lumbar spine series 09/20/2012.  Findings: There is a surgical instrument marking the  L4-5 disc space level.  IMPRESSION: L4-5 marked intraoperatively.l   Original Report Authenticated By: Rudie Meyer, M.D.     EKG: Orders placed in visit on 10/18/10  . EKG     Hospital Course: Patient was admitted to [facility] and taken to the OR and underwent the above state procedure without complications.  Patient tolerated the procedure well and was later transferred to the recovery room and then to the orthopaedic floor for postoperative care.  They were given PO and IV analgesics for pain control following their surgery.  They were given 24 hours of postoperative antibiotics.   PT was consulted postop to assist with mobility and transfers.  The patient was allowed to be WBAT with therapy and was taught back precautions. Discharge planning was consulted to help with postop disposition and equipment needs.  Patient had a good night on the evening of surgery and started to get up OOB same day of surgery without difficulty. Patient was seen in rounds and was ready to go home on day one.  They were given discharge instructions and dressing directions.  They were instructed on when to follow up in the office with Dr. Shelle Iron.  Discharge Medications: Prior to Admission medications   Medication Sig Start Date End Date Taking? Authorizing Provider  naproxen sodium (ANAPROX) 220 MG tablet Take 440 mg by mouth every morning.   Yes Historical Provider, MD    sertraline (ZOLOFT) 100 MG tablet Take 100 mg by mouth every morning.   Yes Historical Provider, MD  methocarbamol (ROBAXIN) 500 MG tablet Take 1 tablet (500 mg total) by mouth 4 (four) times daily. 09/27/12   Dayna Barker. Bissell, PA-C  oxyCODONE-acetaminophen (ROXICET) 5-325 MG per tablet Take 1-2 tablets by mouth every 4 (four) hours as needed for pain. 09/27/12   Dayna Barker. Christene Lye, PA-C    Diet: Regular diet Activity:WBAT Follow-up:in 10-14 days Disposition - Home Discharged Condition: good   Discharge Orders   Future Orders Complete By Expires     Call MD / Call 911  As directed     Comments:      If you experience chest pain or shortness of breath, CALL 911 and be transported to the hospital emergency room.  If you develope a fever above 101 F, pus (white drainage) or increased drainage or redness at the wound, or calf pain, call your surgeon's office.    Constipation Prevention  As directed     Comments:      Drink plenty of fluids.  Prune juice may be helpful.  You may use a stool softener, such as Colace (over the counter) 100 mg twice a day.  Use MiraLax (over the counter) for constipation as needed.    Diet - low sodium heart healthy  As directed     Increase activity slowly as tolerated  As directed         Medication List    TAKE these medications       methocarbamol 500 MG tablet  Commonly known as:  ROBAXIN  Take 1 tablet (500 mg total) by mouth 4 (four) times daily.     naproxen sodium 220 MG tablet  Commonly known as:  ANAPROX  Take 440 mg by mouth every morning.     oxyCODONE-acetaminophen 5-325 MG per tablet  Commonly known as:  ROXICET  Take 1-2 tablets by mouth every 4 (four) hours as needed for pain.     sertraline 100 MG tablet  Commonly known as:  ZOLOFT  Take 100 mg by mouth every morning.           Follow-up Information   Follow up with BEANE,JEFFREY C, MD In 10 days.   Contact information:   [address] [place] [address] [place]  [address] [phone]       Signed: Dorothy Spark. 09/28/2012, 11:13 AM

## 2013-02-22 ENCOUNTER — Other Ambulatory Visit: Payer: Self-pay | Admitting: Orthopedic Surgery

## 2013-02-26 ENCOUNTER — Encounter (HOSPITAL_COMMUNITY): Payer: Self-pay | Admitting: Pharmacy Technician

## 2013-02-26 ENCOUNTER — Other Ambulatory Visit: Payer: Self-pay | Admitting: Orthopedic Surgery

## 2013-02-26 NOTE — Progress Notes (Signed)
Need orders in EPIC please - surg FRI 03/01/13 - Thank you

## 2013-02-27 ENCOUNTER — Encounter (HOSPITAL_COMMUNITY): Payer: Self-pay | Admitting: *Deleted

## 2013-02-27 ENCOUNTER — Other Ambulatory Visit: Payer: Self-pay | Admitting: Orthopedic Surgery

## 2013-02-27 ENCOUNTER — Other Ambulatory Visit (HOSPITAL_COMMUNITY): Payer: Self-pay | Admitting: *Deleted

## 2013-02-27 NOTE — H&P (Signed)
[name] is an 42 y.o. male.   Chief Complaint: right buttock and leg pain HPI: Pt with prior hx of recent lumbar decompression L4-5 right in March 2014. Recurrent RLE pain refractory to conservative tx. We had ordered a STAT MRI because he had severe increase in his pain radiating down his leg. No back pain but buttock and right leg pain. He apparently had been driving for multiple hours on multiple trips, and then he was doing some detailing on his car, doing some crunches, and he had a pain shooting down into his leg.  His last surgery was back in March. The majority of his pain is into the leg. He is a little better on the Dosepak but it has now returned. He's been trying some stretches. He can't work. He can't sit. He's reported pain shooting down into his leg. He had SLR and EHL weakness when seen. Past Medical History  Diagnosis Date  . Depression   . GERD (gastroesophageal reflux disease)   . H/O hiatal hernia     Past Surgical History  Procedure Laterality Date  . Cholecystectomy  1996  . Back surgery N/A     lower  . Right rotator cuff repair  2004  . Lumbar laminectomy/decompression microdiscectomy N/A 09/27/2012    Procedure: REDO LUMBAR LAMINECTOMY/DECOMPRESSION MICRODISCECTOMY 1 LEVEL;  Surgeon: Javier Docker, MD;  Location: [facility] ORS;  Service: Orthopedics;  Laterality: N/A;    No family history on file. Social History:  reports that he has never smoked. He has never used smokeless tobacco. He reports that  drinks alcohol. He reports that he does not use illicit drugs.  Allergies: No Known Allergies   (Not in a hospital admission)  No results found for this or any previous visit (from the past 48 hour(s)). No results found.  Review of Systems  Constitutional: Negative.   HENT: Negative.   Eyes: Negative.   Respiratory: Negative.   Cardiovascular: Negative.   Gastrointestinal: Negative.   Genitourinary: Negative.   Musculoskeletal: Positive  for back pain.  Skin: Negative.   Neurological: Positive for focal weakness.  Psychiatric/Behavioral: Negative.     There were no vitals taken for this visit. Physical Exam  Constitutional: He is oriented to person, place, and time. He appears well-developed and well-nourished. He appears distressed.  HENT:  Head: Normocephalic and atraumatic.  Eyes: Conjunctivae and EOM are normal. Pupils are equal, round, and reactive to light.  Neck: Normal range of motion. Neck supple.  Cardiovascular: Normal rate and regular rhythm.   Respiratory: Effort normal and breath sounds normal.  GI: Soft. Bowel sounds are normal.  Musculoskeletal:  + SLR right EHL weakness right Normoreflexic. Sensory exam is intact. No Babinski or clonus. No instability in hips, knees, and ankles. He has some pain into the lateral aspect of the hip.  Neurological: He is alert and oriented to person, place, and time. He has normal reflexes.  Skin: Skin is warm and dry.  Psychiatric: He has a normal mood and affect.    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 He does not want to do that if the epidural did not help last time. We discussed options. If he had a fusion it would require 2 levels. It is not unstable. It is mainly buttock and leg pain. This was kind of an extraordinary circumstance. I do think an option is a decompression.  He would like to proceed with that. He does not want to wait. He is unable to work. He is in severe pain. He has had P.T., activity modification. He was doing exercises on his own at home. I have offered him continued conservative treatment, but he basically indicated he wants to proceed ASAP because he's missing a lot of work. We will proceed with a decompression without the fusion. We did discuss the fusion in the future which, again, would require 2 levels.  It is fairly extensive. That may be a requirement in the future.  Dr. Shelle Iron previously discussed risks, complications, and alternatives with the pt including but not limited to DVT, PE, infx, bleeding, failure of procedure, need for secondary procedure, nerve injury, dural tear, CSF leak, anesthesia risk, even death. All questions answered and he desires to proceed.  Plan: Redo microlumbar decompression L4-5 right  BISSELL, JACLYN M. for Dr. Shelle Iron 02/27/2013, 12:44 PM

## 2013-03-01 ENCOUNTER — Ambulatory Visit (HOSPITAL_COMMUNITY): Payer: Federal, State, Local not specified - PPO

## 2013-03-01 ENCOUNTER — Encounter (HOSPITAL_COMMUNITY)
Admission: RE | Disposition: A | Discharge status: Home / Self Care | Payer: Self-pay | Source: Ambulatory Visit | Attending: Specialist

## 2013-03-01 ENCOUNTER — Encounter (HOSPITAL_COMMUNITY): Payer: Self-pay | Admitting: Anesthesiology

## 2013-03-01 ENCOUNTER — Ambulatory Visit (HOSPITAL_COMMUNITY): Payer: Federal, State, Local not specified - PPO | Admitting: Anesthesiology

## 2013-03-01 ENCOUNTER — Encounter (HOSPITAL_COMMUNITY): Payer: Self-pay | Admitting: *Deleted

## 2013-03-01 ENCOUNTER — Ambulatory Visit (HOSPITAL_COMMUNITY)
Admission: RE | Admit: 2013-03-01 | Discharge: 2013-03-02 | Disposition: A | Discharge status: Home / Self Care | Payer: Federal, State, Local not specified - PPO | Source: Ambulatory Visit | Attending: Specialist | Admitting: Specialist

## 2013-03-01 DIAGNOSIS — M5126 Other intervertebral disc displacement, lumbar region: Secondary | ICD-10-CM | POA: Diagnosis present

## 2013-03-01 DIAGNOSIS — K219 Gastro-esophageal reflux disease without esophagitis: Secondary | ICD-10-CM | POA: Diagnosis not present

## 2013-03-01 HISTORY — DX: Other intervertebral disc displacement, lumbar region: M51.26

## 2013-03-01 HISTORY — PX: LUMBAR LAMINECTOMY/DECOMPRESSION MICRODISCECTOMY: SHX5026

## 2013-03-01 LAB — CBC
MCH: 31.7 pg (ref 26.0–34.0)
MCHC: 34.9 g/dL (ref 30.0–36.0)
MCV: 90.9 fL (ref 78.0–100.0)
Platelets: 184 10*3/uL (ref 150–400)
RDW: 13.3 % (ref 11.5–15.5)

## 2013-03-01 LAB — BASIC METABOLIC PANEL
CO2: 28 mEq/L (ref 19–32)
Calcium: 9.7 mg/dL (ref 8.4–10.5)
Creatinine, Ser: 1.16 mg/dL (ref 0.50–1.35)
Glucose, Bld: 95 mg/dL (ref 70–99)

## 2013-03-01 SURGERY — LUMBAR LAMINECTOMY/DECOMPRESSION MICRODISCECTOMY 1 LEVEL
Anesthesia: General | Site: Back | Laterality: Right | Wound class: Clean

## 2013-03-01 MED ORDER — METHOCARBAMOL 100 MG/ML IJ SOLN
500.0000 mg | Freq: Four times a day (QID) | INTRAVENOUS | Status: DC | PRN
  Filled 2013-03-01: qty 5

## 2013-03-01 MED ORDER — THROMBIN 5000 UNITS EX SOLR
CUTANEOUS | Status: AC
  Filled 2013-03-01: qty 5000

## 2013-03-01 MED ORDER — NEOSTIGMINE METHYLSULFATE 1 MG/ML IJ SOLN
INTRAMUSCULAR | Status: DC | PRN
  Administered 2013-03-01: 6 mg via INTRAVENOUS

## 2013-03-01 MED ORDER — SODIUM CHLORIDE 0.45 % IV SOLN
INTRAVENOUS | Status: DC
  Administered 2013-03-01: 50 mL/h via INTRAVENOUS

## 2013-03-01 MED ORDER — DOCUSATE SODIUM 100 MG PO CAPS
100.0000 mg | ORAL_CAPSULE | Freq: Two times a day (BID) | ORAL | Status: DC
  Administered 2013-03-01 – 2013-03-02 (×2): 100 mg via ORAL

## 2013-03-01 MED ORDER — MUPIROCIN 2 % EX OINT
TOPICAL_OINTMENT | CUTANEOUS | Status: AC
  Administered 2013-03-01: 1 via NASAL
  Filled 2013-03-01: qty 22

## 2013-03-01 MED ORDER — HYDROCODONE-ACETAMINOPHEN 5-325 MG PO TABS: 1.0000 | ORAL_TABLET | ORAL | Status: DC | PRN

## 2013-03-01 MED ORDER — EPHEDRINE SULFATE 50 MG/ML IJ SOLN
INTRAMUSCULAR | Status: DC | PRN
  Administered 2013-03-01 (×2): 5 mg via INTRAVENOUS
  Administered 2013-03-01: 10 mg via INTRAVENOUS
  Administered 2013-03-01: 5 mg via INTRAVENOUS

## 2013-03-01 MED ORDER — CEFAZOLIN SODIUM-DEXTROSE 2-3 GM-% IV SOLR
INTRAVENOUS | Status: AC
  Filled 2013-03-01: qty 50

## 2013-03-01 MED ORDER — PHENOL 1.4 % MT LIQD: 1.0000 | OROMUCOSAL | Status: DC | PRN

## 2013-03-01 MED ORDER — ACETAMINOPHEN 650 MG RE SUPP: 650.0000 mg | RECTAL | Status: DC | PRN

## 2013-03-01 MED ORDER — PROMETHAZINE HCL 25 MG/ML IJ SOLN: 6.2500 mg | INTRAMUSCULAR | Status: DC | PRN

## 2013-03-01 MED ORDER — LACTATED RINGERS IV SOLN: INTRAVENOUS | Status: DC

## 2013-03-01 MED ORDER — SODIUM CHLORIDE 0.9 % IV SOLN: 250.0000 mL | INTRAVENOUS | Status: DC

## 2013-03-01 MED ORDER — BUPIVACAINE-EPINEPHRINE 0.5% -1:200000 IJ SOLN
INTRAMUSCULAR | Status: AC
  Filled 2013-03-01: qty 1

## 2013-03-01 MED ORDER — CEFAZOLIN SODIUM-DEXTROSE 2-3 GM-% IV SOLR
2.0000 g | INTRAVENOUS | Status: AC
  Administered 2013-03-01: 2 g via INTRAVENOUS

## 2013-03-01 MED ORDER — MUPIROCIN 2 % EX OINT
TOPICAL_OINTMENT | Freq: Two times a day (BID) | CUTANEOUS | Status: DC
  Administered 2013-03-01: 23:00:00 via NASAL
  Filled 2013-03-01: qty 22

## 2013-03-01 MED ORDER — HYDROMORPHONE HCL PF 1 MG/ML IJ SOLN: 0.5000 mg | INTRAMUSCULAR | Status: DC | PRN

## 2013-03-01 MED ORDER — THROMBIN 5000 UNITS EX SOLR
CUTANEOUS | Status: DC | PRN
  Administered 2013-03-01: 5000 [IU] via TOPICAL

## 2013-03-01 MED ORDER — MENTHOL 3 MG MT LOZG: 1.0000 | LOZENGE | OROMUCOSAL | Status: DC | PRN

## 2013-03-01 MED ORDER — OXYCODONE-ACETAMINOPHEN 5-325 MG PO TABS
1.0000 | ORAL_TABLET | ORAL | Status: DC | PRN
  Administered 2013-03-01 – 2013-03-02 (×2): 2 via ORAL
  Filled 2013-03-01 (×2): qty 2

## 2013-03-01 MED ORDER — PHENYLEPHRINE HCL 10 MG/ML IJ SOLN
INTRAMUSCULAR | Status: DC | PRN
  Administered 2013-03-01: 40 ug via INTRAVENOUS

## 2013-03-01 MED ORDER — CLINDAMYCIN PHOSPHATE 900 MG/50ML IV SOLN
900.0000 mg | Freq: Once | INTRAVENOUS | Status: AC
  Administered 2013-03-01: 900 mg via INTRAVENOUS
  Filled 2013-03-01: qty 50

## 2013-03-01 MED ORDER — METHOCARBAMOL 500 MG PO TABS: 500.0000 mg | ORAL_TABLET | Freq: Three times a day (TID) | ORAL | Status: DC | PRN

## 2013-03-01 MED ORDER — CLINDAMYCIN PHOSPHATE 900 MG/50ML IV SOLN
INTRAVENOUS | Status: AC
  Filled 2013-03-01: qty 50

## 2013-03-01 MED ORDER — KETAMINE HCL 10 MG/ML IJ SOLN
INTRAMUSCULAR | Status: DC | PRN
  Administered 2013-03-01: 25 mg via INTRAVENOUS

## 2013-03-01 MED ORDER — SUCCINYLCHOLINE CHLORIDE 20 MG/ML IJ SOLN
INTRAMUSCULAR | Status: DC | PRN
  Administered 2013-03-01: 120 mg via INTRAVENOUS

## 2013-03-01 MED ORDER — FENTANYL CITRATE 0.05 MG/ML IJ SOLN
INTRAMUSCULAR | Status: DC | PRN
  Administered 2013-03-01 (×5): 50 ug via INTRAVENOUS

## 2013-03-01 MED ORDER — PROPOFOL 10 MG/ML IV BOLUS
INTRAVENOUS | Status: DC | PRN
  Administered 2013-03-01: 200 mg via INTRAVENOUS

## 2013-03-01 MED ORDER — MIDAZOLAM HCL 5 MG/5ML IJ SOLN
INTRAMUSCULAR | Status: DC | PRN
  Administered 2013-03-01 (×2): 1 mg via INTRAVENOUS

## 2013-03-01 MED ORDER — CHLORHEXIDINE GLUCONATE 4 % EX LIQD
60.0000 mL | Freq: Once | CUTANEOUS | Status: DC
  Filled 2013-03-01: qty 60

## 2013-03-01 MED ORDER — ACETAMINOPHEN 325 MG PO TABS: 650.0000 mg | ORAL_TABLET | ORAL | Status: DC | PRN

## 2013-03-01 MED ORDER — SODIUM CHLORIDE 0.9 % IJ SOLN: 3.0000 mL | INTRAMUSCULAR | Status: DC | PRN

## 2013-03-01 MED ORDER — GLYCOPYRROLATE 0.2 MG/ML IJ SOLN
INTRAMUSCULAR | Status: DC | PRN
  Administered 2013-03-01: 0.6 mg via INTRAVENOUS

## 2013-03-01 MED ORDER — SERTRALINE HCL 100 MG PO TABS
200.0000 mg | ORAL_TABLET | Freq: Every morning | ORAL | Status: DC
  Administered 2013-03-02: 200 mg via ORAL
  Filled 2013-03-01: qty 2

## 2013-03-01 MED ORDER — CEFAZOLIN SODIUM-DEXTROSE 2-3 GM-% IV SOLR: INTRAVENOUS | Status: DC | PRN

## 2013-03-01 MED ORDER — ONDANSETRON HCL 4 MG/2ML IJ SOLN: 4.0000 mg | INTRAMUSCULAR | Status: DC | PRN

## 2013-03-01 MED ORDER — HYDROMORPHONE HCL PF 1 MG/ML IJ SOLN: 0.2500 mg | INTRAMUSCULAR | Status: DC | PRN

## 2013-03-01 MED ORDER — LIDOCAINE HCL (CARDIAC) 20 MG/ML IV SOLN
INTRAVENOUS | Status: DC | PRN
  Administered 2013-03-01: 75 mg via INTRAVENOUS

## 2013-03-01 MED ORDER — HYDROCODONE-ACETAMINOPHEN 7.5-325 MG PO TABS: 1.0000 | ORAL_TABLET | ORAL | Status: DC | PRN

## 2013-03-01 MED ORDER — CEFAZOLIN SODIUM-DEXTROSE 2-3 GM-% IV SOLR
2.0000 g | Freq: Three times a day (TID) | INTRAVENOUS | Status: AC
  Administered 2013-03-01 – 2013-03-02 (×2): 2 g via INTRAVENOUS
  Filled 2013-03-01 (×2): qty 50

## 2013-03-01 MED ORDER — ONDANSETRON HCL 4 MG/2ML IJ SOLN
INTRAMUSCULAR | Status: DC | PRN
  Administered 2013-03-01: 4 mg via INTRAVENOUS

## 2013-03-01 MED ORDER — BUPIVACAINE-EPINEPHRINE 0.5% -1:200000 IJ SOLN
INTRAMUSCULAR | Status: DC | PRN
  Administered 2013-03-01: 5 mL

## 2013-03-01 MED ORDER — MEPERIDINE HCL 50 MG/ML IJ SOLN: 6.2500 mg | INTRAMUSCULAR | Status: DC | PRN

## 2013-03-01 MED ORDER — SODIUM CHLORIDE 0.9 % IJ SOLN: 3.0000 mL | Freq: Two times a day (BID) | INTRAMUSCULAR | Status: DC

## 2013-03-01 MED ORDER — SODIUM CHLORIDE 0.9 % IR SOLN
Status: DC | PRN
  Administered 2013-03-01: 17:00:00

## 2013-03-01 MED ORDER — METOCLOPRAMIDE HCL 5 MG/ML IJ SOLN
INTRAMUSCULAR | Status: DC | PRN
  Administered 2013-03-01: 10 mg via INTRAVENOUS

## 2013-03-01 MED ORDER — LACTATED RINGERS IV SOLN
INTRAVENOUS | Status: DC
  Administered 2013-03-01 (×3): via INTRAVENOUS

## 2013-03-01 MED ORDER — METHOCARBAMOL 500 MG PO TABS
500.0000 mg | ORAL_TABLET | Freq: Four times a day (QID) | ORAL | Status: DC | PRN
  Administered 2013-03-02: 500 mg via ORAL
  Filled 2013-03-01: qty 1

## 2013-03-01 MED ORDER — CISATRACURIUM BESYLATE (PF) 10 MG/5ML IV SOLN
INTRAVENOUS | Status: DC | PRN
  Administered 2013-03-01 (×2): 2 mg via INTRAVENOUS
  Administered 2013-03-01: 8 mg via INTRAVENOUS

## 2013-03-01 MED ORDER — DEXAMETHASONE SODIUM PHOSPHATE 10 MG/ML IJ SOLN
INTRAMUSCULAR | Status: DC | PRN
  Administered 2013-03-01: 10 mg via INTRAVENOUS

## 2013-03-01 SURGICAL SUPPLY — 52 items
BAG ZIPLOCK 12X15 (MISCELLANEOUS) ×2 IMPLANT
BENZOIN TINCTURE PRP APPL 2/3 (GAUZE/BANDAGES/DRESSINGS) ×2 IMPLANT
CHLORAPREP W/TINT 26ML (MISCELLANEOUS) IMPLANT
CLEANER TIP ELECTROSURG 2X2 (MISCELLANEOUS) ×2 IMPLANT
CLOTH 2% CHLOROHEXIDINE 3PK (PERSONAL CARE ITEMS) ×2 IMPLANT
CLOTH BEACON ORANGE TIMEOUT ST (SAFETY) ×2 IMPLANT
DECANTER SPIKE VIAL GLASS SM (MISCELLANEOUS) ×2 IMPLANT
DRAPE MICROSCOPE LEICA (MISCELLANEOUS) ×2 IMPLANT
DRAPE POUCH INSTRU U-SHP 10X18 (DRAPES) ×2 IMPLANT
DRAPE SURG 17X11 SM STRL (DRAPES) ×2 IMPLANT
DRSG AQUACEL AG ADV 3.5X 4 (GAUZE/BANDAGES/DRESSINGS) ×2 IMPLANT
DRSG EMULSION OIL 3X3 NADH (GAUZE/BANDAGES/DRESSINGS) IMPLANT
DRSG PAD ABDOMINAL 8X10 ST (GAUZE/BANDAGES/DRESSINGS) IMPLANT
DRSG TELFA 4X5 ISLAND ADH (GAUZE/BANDAGES/DRESSINGS) IMPLANT
DURAPREP 26ML APPLICATOR (WOUND CARE) ×2 IMPLANT
DURASEAL SPINE SEALANT 3ML (MISCELLANEOUS) IMPLANT
ELECT REM PT RETURN 9FT ADLT (ELECTROSURGICAL) ×2
ELECTRODE REM PT RTRN 9FT ADLT (ELECTROSURGICAL) ×1 IMPLANT
GLOVE BIOGEL PI IND STRL 7.5 (GLOVE) ×1 IMPLANT
GLOVE BIOGEL PI IND STRL 8 (GLOVE) ×1 IMPLANT
GLOVE BIOGEL PI INDICATOR 7.5 (GLOVE) ×1
GLOVE BIOGEL PI INDICATOR 8 (GLOVE) ×1
GLOVE SURG SS PI 7.5 STRL IVOR (GLOVE) ×2 IMPLANT
GLOVE SURG SS PI 8.0 STRL IVOR (GLOVE) ×4 IMPLANT
GOWN PREVENTION PLUS LG XLONG (DISPOSABLE) ×2 IMPLANT
GOWN STRL REIN XL XLG (GOWN DISPOSABLE) ×4 IMPLANT
IV CATH 14GX2 1/4 (CATHETERS) ×2 IMPLANT
KIT BASIN OR (CUSTOM PROCEDURE TRAY) ×2 IMPLANT
KIT POSITIONING SURG ANDREWS (MISCELLANEOUS) ×2 IMPLANT
MANIFOLD NEPTUNE II (INSTRUMENTS) ×2 IMPLANT
NEEDLE SPNL 18GX3.5 QUINCKE PK (NEEDLE) ×6 IMPLANT
PATTIES SURGICAL .5 X.5 (GAUZE/BANDAGES/DRESSINGS) ×2 IMPLANT
PATTIES SURGICAL .75X.75 (GAUZE/BANDAGES/DRESSINGS) ×2 IMPLANT
PATTIES SURGICAL 1X1 (DISPOSABLE) ×2 IMPLANT
SPONGE SURGIFOAM ABS GEL 100 (HEMOSTASIS) ×2 IMPLANT
STAPLER VISISTAT (STAPLE) IMPLANT
STRIP CLOSURE SKIN 1/2X4 (GAUZE/BANDAGES/DRESSINGS) ×2 IMPLANT
SUT NURALON 4 0 TR CR/8 (SUTURE) IMPLANT
SUT PROLENE 3 0 PS 2 (SUTURE) IMPLANT
SUT VIC AB 0 CT1 27 (SUTURE)
SUT VIC AB 0 CT1 27XBRD ANTBC (SUTURE) IMPLANT
SUT VIC AB 1 CT1 27 (SUTURE) ×1
SUT VIC AB 1 CT1 27XBRD ANTBC (SUTURE) ×1 IMPLANT
SUT VIC AB 1-0 CT2 27 (SUTURE) ×2 IMPLANT
SUT VIC AB 2-0 CT1 27 (SUTURE)
SUT VIC AB 2-0 CT1 TAPERPNT 27 (SUTURE) IMPLANT
SUT VIC AB 2-0 CT2 27 (SUTURE) ×2 IMPLANT
SUT VICRYL 0 27 CT2 27 ABS (SUTURE) ×4 IMPLANT
SUT VICRYL 0 UR6 27IN ABS (SUTURE) IMPLANT
SYRINGE 10CC LL (SYRINGE) ×4 IMPLANT
TRAY LAMINECTOMY (CUSTOM PROCEDURE TRAY) ×2 IMPLANT
YANKAUER SUCT BULB TIP NO VENT (SUCTIONS) ×2 IMPLANT

## 2013-03-01 NOTE — Anesthesia Procedure Notes (Signed)
Procedure Name: Intubation Date/Time: 03/01/2013 3:01 PM Performed by: Edison Pace Pre-anesthesia Checklist: Patient identified, Emergency Drugs available, Suction available, Patient being monitored and Timeout performed Patient Re-evaluated:Patient Re-evaluated prior to inductionOxygen Delivery Method: Circle system utilized Preoxygenation: Pre-oxygenation with 100% oxygen Intubation Type: IV induction and Cricoid Pressure applied Ventilation: Mask ventilation without difficulty Laryngoscope Size: Mac and 4 Grade View: Grade II Tube type: Oral Tube size: 7.5 mm Number of attempts: 1 Airway Equipment and Method: Stylet Placement Confirmation: ETT inserted through vocal cords under direct vision,  CO2 detector,  positive ETCO2 and breath sounds checked- equal and bilateral Secured at: 21 cm Tube secured with: Tape Dental Injury: Teeth and Oropharynx as per pre-operative assessment

## 2013-03-01 NOTE — Anesthesia Postprocedure Evaluation (Signed)
  Anesthesia Post-op Note  Patient: [name]  Procedure(s) Performed: Procedure(s) (LRB): REDO MICRO LUMBAR DECOMPRESSION  L4-L5 ON RIGHT (Right)  Patient Location: PACU  Anesthesia Type: General  Level of Consciousness: awake and alert   Airway and Oxygen Therapy: Patient Spontanous Breathing  Post-op Pain: mild  Post-op Assessment: Post-op Vital signs reviewed, Patient's Cardiovascular Status Stable, Respiratory Function Stable, Patent Airway and No signs of Nausea or vomiting  Last Vitals:  Filed Vitals:   03/01/13 1911  BP: 111/70  Pulse: 74  Temp: 36.8 C  Resp: 18    Post-op Vital Signs: stable   Complications: No apparent anesthesia complications

## 2013-03-01 NOTE — Transfer of Care (Signed)
Immediate Anesthesia Transfer of Care Note  Patient: [name]  Procedure(s) Performed: Procedure(s): REDO MICRO LUMBAR DECOMPRESSION  L4-L5 ON RIGHT (Right)  Patient Location: PACU  Anesthesia Type:General  Level of Consciousness: awake, alert , oriented, patient cooperative and responds to stimulation  Airway & Oxygen Therapy: Patient Spontanous Breathing and Patient connected to face mask  Post-op Assessment: Report given to PACU RN, Post -op Vital signs reviewed and stable and Patient moving all extremities X 4  Post vital signs: Reviewed and stable  Complications: No apparent anesthesia complications

## 2013-03-01 NOTE — Anesthesia Preprocedure Evaluation (Signed)
Anesthesia Evaluation  Patient identified by MRN, date of birth, ID band Patient awake    Reviewed: Allergy & Precautions, H&P , NPO status , Patient's Chart, lab work & pertinent test results  Airway Mallampati: I TM Distance: >3 FB Neck ROM: Full    Dental  (+) Dental Advisory Given and Teeth Intact   Pulmonary neg pulmonary ROS,  breath sounds clear to auscultation  Pulmonary exam normal       Cardiovascular negative cardio ROS  Rhythm:Regular Rate:Normal     Neuro/Psych PSYCHIATRIC DISORDERS Depression negative neurological ROS     GI/Hepatic Neg liver ROS, hiatal hernia, GERD-  Medicated,  Endo/Other  negative endocrine ROS  Renal/GU negative Renal ROS  negative genitourinary   Musculoskeletal negative musculoskeletal ROS (+)   Abdominal   Peds negative pediatric ROS (+)  Hematology negative hematology ROS (+)   Anesthesia Other Findings   Reproductive/Obstetrics                           Anesthesia Physical  Anesthesia Plan  ASA: II  Anesthesia Plan: General   Post-op Pain Management:    Induction: Intravenous  Airway Management Planned: Oral ETT  Additional Equipment:   Intra-op Plan:   Post-operative Plan: Extubation in OR  Informed Consent: I have reviewed the patients History and Physical, chart, labs and discussed the procedure including the risks, benefits and alternatives for the proposed anesthesia with the patient or authorized representative who has indicated his/her understanding and acceptance.   Dental advisory given  Plan Discussed with: CRNA  Anesthesia Plan Comments:         Anesthesia Quick Evaluation

## 2013-03-01 NOTE — Interval H&P Note (Signed)
History and Physical Interval Note:  03/01/2013 2:27 PM  [name]  has presented today for surgery, with the diagnosis of HERNIATED DISC L4-L5  The various methods of treatment have been discussed with the patient and family. After consideration of risks, benefits and other options for treatment, the patient has consented to  Procedure(s): REDO MICRO LUMBAR DECOMPRESSION  L4-L5 ON RIGHT (Right) as a surgical intervention .  The patient's history has been reviewed, patient examined, no change in status, stable for surgery.  I have reviewed the patient's chart and labs.  Questions were answered to the patient's satisfaction.     Renlee Floor C

## 2013-03-01 NOTE — Brief Op Note (Signed)
03/01/2013  5:16 PM  PATIENT:  [name]  42 y.o. male  PRE-OPERATIVE DIAGNOSIS:  HERNIATED DISC L4-L5  POST-OPERATIVE DIAGNOSIS:  HERNIATED DISC L4-L5  PROCEDURE:  Procedure(s): REDO MICRO LUMBAR DECOMPRESSION  L4-L5 ON RIGHT (Right)  SURGEON:  Surgeon(s) and Role:    * Javier Docker, MD - Primary  PHYSICIAN ASSISTANT:   ASSISTANTS: Bissell   ANESTHESIA:   general  EBL:  Total I/O In: 2000 [I.V.:2000] Out: -   BLOOD ADMINISTERED:none  DRAINS: none   LOCAL MEDICATIONS USED:  MARCAINE     SPECIMEN:  Source of Specimen:  L45  DISPOSITION OF SPECIMEN:  PATHOLOGY  COUNTS:  YES  TOURNIQUET:  * No tourniquets in log *  DICTATION: .Other Dictation: Dictation Number [id number]  PLAN OF CARE: Admit for overnight observation  PATIENT DISPOSITION:  PACU - hemodynamically stable.   Delay start of Pharmacological VTE agent (>24hrs) due to surgical blood loss or risk of bleeding: yes

## 2013-03-01 NOTE — H&P (View-Only) (Signed)
[name] is an 41 y.o. male.   Chief Complaint: right buttock and leg pain HPI: Pt with prior hx of recent lumbar decompression L4-5 right in March 2014. Recurrent RLE pain refractory to conservative tx. We had ordered a STAT MRI because he had severe increase in his pain radiating down his leg. No back pain but buttock and right leg pain. He apparently had been driving for multiple hours on multiple trips, and then he was doing some detailing on his car, doing some crunches, and he had a pain shooting down into his leg.  His last surgery was back in March. The majority of his pain is into the leg. He is a little better on the Dosepak but it has now returned. He's been trying some stretches. He can't work. He can't sit. He's reported pain shooting down into his leg. He had SLR and EHL weakness when seen. Past Medical History  Diagnosis Date  . Depression   . GERD (gastroesophageal reflux disease)   . H/O hiatal hernia     Past Surgical History  Procedure Laterality Date  . Cholecystectomy  1996  . Back surgery N/A     lower  . Right rotator cuff repair  2004  . Lumbar laminectomy/decompression microdiscectomy N/A 09/27/2012    Procedure: REDO LUMBAR LAMINECTOMY/DECOMPRESSION MICRODISCECTOMY 1 LEVEL;  Surgeon: Jeffrey C Beane, MD;  Location: [facility];  Service: Orthopedics;  Laterality: N/A;    No family history on file. Social History:  reports that he has never smoked. He has never used smokeless tobacco. He reports that  drinks alcohol. He reports that he does not use illicit drugs.  Allergies: No Known Allergies   (Not in a hospital admission)  No results found for this or any previous visit (from the past 48 hour(s)). No results found.  Review of Systems  Constitutional: Negative.   HENT: Negative.   Eyes: Negative.   Respiratory: Negative.   Cardiovascular: Negative.   Gastrointestinal: Negative.   Genitourinary: Negative.   Musculoskeletal: Positive  for back pain.  Skin: Negative.   Neurological: Positive for focal weakness.  Psychiatric/Behavioral: Negative.     There were no vitals taken for this visit. Physical Exam  Constitutional: He is oriented to person, place, and time. He appears well-developed and well-nourished. He appears distressed.  HENT:  Head: Normocephalic and atraumatic.  Eyes: Conjunctivae and EOM are normal. Pupils are equal, round, and reactive to light.  Neck: Normal range of motion. Neck supple.  Cardiovascular: Normal rate and regular rhythm.   Respiratory: Effort normal and breath sounds normal.  GI: Soft. Bowel sounds are normal.  Musculoskeletal:  + SLR right EHL weakness right Normoreflexic. Sensory exam is intact. No Babinski or clonus. No instability in hips, knees, and ankles. He has some pain into the lateral aspect of the hip.  Neurological: He is alert and oriented to person, place, and time. He has normal reflexes.  Skin: Skin is warm and dry.  Psychiatric: He has a normal mood and affect.    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 He does not want to do that if the epidural did not help last time. We discussed options. If he had a fusion it would require 2 levels. It is not unstable. It is mainly buttock and leg pain. This was kind of an extraordinary circumstance. I do think an option is a decompression.   He would like to proceed with that. He does not want to wait. He is unable to work. He is in severe pain. He has had P.T., activity modification. He was doing exercises on his own at home. I have offered him continued conservative treatment, but he basically indicated he wants to proceed ASAP because he's missing a lot of work. We will proceed with a decompression without the fusion. We did discuss the fusion in the future which, again, would require 2 levels.  It is fairly extensive. That may be a requirement in the future.  Dr. Beane previously discussed risks, complications, and alternatives with the pt including but not limited to DVT, PE, infx, bleeding, failure of procedure, need for secondary procedure, nerve injury, dural tear, CSF leak, anesthesia risk, even death. All questions answered and he desires to proceed.  Plan: Redo microlumbar decompression L4-5 right  [name] M. for Dr. Beane 02/27/2013, 12:44 PM    

## 2013-03-02 DIAGNOSIS — M5126 Other intervertebral disc displacement, lumbar region: Secondary | ICD-10-CM | POA: Diagnosis not present

## 2013-03-02 MED ORDER — DSS 100 MG PO CAPS: 100.0000 mg | ORAL_CAPSULE | Freq: Two times a day (BID) | ORAL | Status: DC

## 2013-03-02 NOTE — Evaluation (Signed)
Physical Therapy Evaluation Patient Details Name: JOBANI SABADO MRN: [id number] DOB: 09-20-1970 Today's Date: 03/02/2013 Time: 0950-1004 PT Time Calculation (min): 14 min  PT Assessment / Plan / Recommendation History of Present Illness  s/p repeat lumbar decompression L4-L5, foraminotomies L4-L5, microdiscectomy L4, L5 R  Clinical Impression  On eval, pt was Ind with all mobility-able to ambulate ~200 feet without assistive device or difficulty. No follow up needs at discharge. 1x eval. Ready to d/c home from PT standpoint    PT Assessment  Patent does not need any further PT services    Follow Up Recommendations  No PT follow up    Does the patient have the potential to tolerate intense rehabilitation      Barriers to Discharge        Equipment Recommendations  None recommended by PT    Recommendations for Other Services OT consult   Frequency      Precautions / Restrictions Precautions Precautions: Back Precaution Comments: Pt familiar with back precautions from previous surgery. Reviewed logroll technique as well.  Restrictions Weight Bearing Restrictions: No   Pertinent Vitals/Pain Pt denied back pain during session      Mobility  Bed Mobility Bed Mobility: Rolling Right;Right Sidelying to Sit Rolling Right: 7: Independent Right Sidelying to Sit: 7: Independent Transfers Transfers: Sit to Stand;Stand to Sit Sit to Stand: 7: Independent Stand to Sit: 7: Independent Ambulation/Gait Ambulation/Gait Assistance: 7: Independent Ambulation Distance (Feet): 200 Feet Gait Pattern: Within Functional Limits Stairs: Yes Stairs Assistance: 7: Independent Stair Management Technique: No rails Number of Stairs: 4    Exercises     PT Diagnosis:    PT Problem List:   PT Treatment Interventions:       PT Goals(Current goals can be found in the care plan section) Acute Rehab PT Goals Patient Stated Goal: home PT Goal Formulation: No goals set, d/c  therapy  Visit Information  Last PT Received On: 03/02/13 Assistance Needed: +1 PT/OT Co-Evaluation/Treatment: Yes History of Present Illness: s/p repeat lumbar decompression L4-L5, foraminotomies L4-L5, microdiscectomy L4, L5 R       Prior Functioning  Home Living Family/patient expects to be discharged to:: Private residence Living Arrangements: Spouse/significant other Type of Home: House Home Access: Stairs to enter Secretary/administrator of Steps: 3 Entrance Stairs-Rails: Right Home Layout: One level Prior Function Level of Independence: Independent Communication Communication: No difficulties    Cognition  Cognition Arousal/Alertness: Awake/alert Behavior During Therapy: WFL for tasks assessed/performed Overall Cognitive Status: Within Functional Limits for tasks assessed    Extremity/Trunk Assessment Upper Extremity Assessment Upper Extremity Assessment: Overall WFL for tasks assessed Lower Extremity Assessment Lower Extremity Assessment: Overall WFL for tasks assessed Cervical / Trunk Assessment Cervical / Trunk Assessment: Normal   Balance    End of Session PT - End of Session Activity Tolerance: Patient tolerated treatment well Patient left:  (standing in room (wife present))  GP Functional Assessment Tool Used: clinical judgement Functional Limitation: Mobility: Walking and moving around Mobility: Walking and Moving Around Current Status (W0981): 0 percent impaired, limited or restricted Mobility: Walking and Moving Around Goal Status (X9147): 0 percent impaired, limited or restricted Mobility: Walking and Moving Around Discharge Status [phone]): 0 percent impaired, limited or restricted   Rebeca Alert, MPT Pager: [phone]

## 2013-03-02 NOTE — Progress Notes (Signed)
   Subjective: 1 Day Post-Op Procedure(s) (LRB): REDO MICRO LUMBAR DECOMPRESSION  L4-L5 ON RIGHT (Right)   Patient reports pain as mild, pain well controlled. No events throughout the night. States he is doing great and ready to go home.   Objective:   VITALS:   Filed Vitals:   03/02/13 0201  BP: 118/67  Pulse: 76  Temp: 98.4 F (36.9 C)  Resp: 16    Neurovascular intact Dorsiflexion/Plantar flexion intact Incision: dressing C/D/I No cellulitis present Compartment soft  LABS  Recent Labs  03/01/13 1230  HGB 16.0  HCT 45.9  WBC 8.0  PLT 184     Recent Labs  03/01/13 1230  NA 137  K 3.6  BUN 13  CREATININE 1.16  GLUCOSE 95     Assessment/Plan: 1 Day Post-Op Procedure(s) (LRB): REDO MICRO LUMBAR DECOMPRESSION  L4-L5 ON RIGHT (Right)  Advance diet Up with therapy D/C IV fluids Discharge home with home health Follow up in 2 weeks at [facility]. Follow up with Dr. Shelle Iron in 2 weeks.  Contact information:  [facility] [address], [address] [place] [address] [phone]        Anastasio Auerbach. Mry Lamia   PAC  03/02/2013, 8:55 AM

## 2013-03-02 NOTE — Discharge Summary (Signed)
Physician Discharge Summary   Patient ID: [name] MRN: [id number] DOB/AGE: 1971/03/21 42 y.o.  Admit date: 03/01/2013 Discharge date: 03/02/2013  Primary Diagnosis:   HERNIATED DISC L4-L5  Admission Diagnoses:  Past Medical History  Diagnosis Date  . Depression   . GERD (gastroesophageal reflux disease)   . H/O hiatal hernia   . HNP (herniated nucleus pulposus), lumbar    Discharge Diagnoses:   Principal Problem:   HNP (herniated nucleus pulposus), lumbar  Procedure:  Procedure(s) (LRB): REDO MICRO LUMBAR DECOMPRESSION  L4-L5 ON RIGHT (Right)   Consults: None  HPI:  see H&P    Laboratory Data: Hospital Outpatient Visit on 09/20/2012  Component Date Value Range Status  . Sodium 09/20/2012 143  135 - 145 mEq/L Final  . Potassium 09/20/2012 3.5  3.5 - 5.1 mEq/L Final  . Chloride 09/20/2012 103  96 - 112 mEq/L Final  . CO2 09/20/2012 32  19 - 32 mEq/L Final  . Glucose, Bld 09/20/2012 87  70 - 99 mg/dL Final  . BUN 64/40/3474 14  6 - 23 mg/dL Final  . Creatinine, Ser 09/20/2012 0.94  0.50 - 1.35 mg/dL Final  . Calcium 25/95/6387 9.3  8.4 - 10.5 mg/dL Final  . GFR calc non Af Amer 09/20/2012 >90  >90 mL/min Final  . GFR calc Af Amer 09/20/2012 >90  >90 mL/min Final   Comment:                                 The eGFR has been calculated                          using the CKD EPI equation.                          This calculation has not been                          validated in all clinical                          situations.                          eGFR's persistently                          <90 mL/min signify                          possible Chronic Kidney Disease.  . WBC 09/20/2012 7.4  4.0 - 10.5 K/uL Final  . RBC 09/20/2012 4.78  4.22 - 5.81 MIL/uL Final  . Hemoglobin 09/20/2012 15.0  13.0 - 17.0 g/dL Final  . HCT 56/43/3295 42.0  39.0 - 52.0 % Final  . MCV 09/20/2012 87.9  78.0 - 100.0 fL Final  . MCH 09/20/2012 31.4  26.0 - 34.0 pg Final    . MCHC 09/20/2012 35.7  30.0 - 36.0 g/dL Final  . RDW 18/84/1660 12.7  11.5 - 15.5 % Final  . Platelets 09/20/2012 235  150 - 400 K/uL Final  . MRSA, PCR 09/20/2012 NEGATIVE  NEGATIVE Final  . Staphylococcus aureus 09/20/2012 NEGATIVE  NEGATIVE Final   Comment:  The Xpert SA Assay (FDA                          approved for NASAL specimens                          in patients over 70 years of age),                          is one component of                          a comprehensive surveillance                          program.  Test performance has                          been validated by [employer] for patients greater                          than or equal to 66 year old.                          It is not intended                          to diagnose infection nor to                          guide or monitor treatment.    Recent Labs  03/01/13 1230  HGB 16.0    Recent Labs  03/01/13 1230  WBC 8.0  RBC 5.05  HCT 45.9  PLT 184    Recent Labs  03/01/13 1230  NA 137  K 3.6  CL 101  CO2 28  BUN 13  CREATININE 1.16  GLUCOSE 95  CALCIUM 9.7   No results found for this basename: LABPT, INR,  in the last 72 hours  X-Rays:Dg Lumbar Spine 2-3 Views  03/01/2013   *RADIOLOGY REPORT*  Clinical Data: Preoperative evaluation for lumbar decompression. Recurrent HNP at L4-5  LUMBAR SPINE - 2-3 VIEW  Comparison: 09/20/2012  Findings: There are five lumbar type vertebral bodies identified. Vertebral body heights appear maintained.  There is disc space height loss at the L4-5 and L5-S1 levels which appears stable in comparison with the prior exam.  Bony alignment appears maintained. Associated endplate osteophytosis is seen at these levels as well as facet sclerosis and hypertrophy at the L5-S1 levels bilaterally.  The sacroiliac joints and sacral white lines appear maintained.  Surgical clips are seen in the right upper  quadrant.  IMPRESSION: Degenerative changes as described above, stable in comparison with the prior exam.  No acute or new focal abnormality is seen radiographically.   Original Report Authenticated By: Rhodia Albright, M.D.   Dg Spine Portable 1 View  03/01/2013   CLINICAL DATA:  42 year old male undergoing lumbar spine surgery.  EXAM: PORTABLE SPINE - 1 VIEW  COMPARISON:  1538 hr the same day and earlier.  FINDINGS: Intraoperative portable  cross-table lateral view of the lumbar spine labeled film #2 At 1634 hrs. Surgical probe advanced at the L4-L5 disc space level, with adjacent L4 and L5 vertebral body level probes.  IMPRESSION: Intraoperative localization at L4-L5.   Electronically Signed   By: Augusto Gamble   On: 03/01/2013 16:44   Dg Spine Portable 1 View  03/01/2013   CLINICAL DATA:  42 year old male undergoing lumbar spine surgery.  EXAM: PORTABLE SPINE - 1 VIEW  COMPARISON:  1259 hr the same day and earlier.  FINDINGS: Portable cross-table lateral intraoperative view of lumbar spine labeled film 1. At 1538 hrs. Surgical probe directed at the L5 pedicle level.  IMPRESSION: Intraoperative localization at the L5 pedicle level.   Electronically Signed   By: Augusto Gamble   On: 03/01/2013 15:49    EKG: Orders placed in visit on 10/18/10  . EKG     Hospital Course: Patient was admitted to [facility] and taken to the OR and underwent the above state procedure without complications.  Patient tolerated the procedure well and was later transferred to the recovery room and then to the orthopaedic floor for postoperative care.  They were given PO and IV analgesics for pain control following their surgery.  They were given 24 hours of postoperative antibiotics.   PT was consulted postop to assist with mobility and transfers.  The patient was allowed to be WBAT with therapy and was taught back precautions. Discharge planning was consulted to help with postop disposition and equipment needs.  Patient had  a good night on the evening of surgery and started to get up OOB with therapy on day one. Patient was seen in rounds and was ready to go home on day one.  They were given discharge instructions and dressing directions.  They were instructed on when to follow up in the office with Dr. Shelle Iron.  Discharge Medications: Prior to Admission medications   Medication Sig Start Date End Date Taking? Authorizing Provider  sertraline (ZOLOFT) 100 MG tablet Take 200 mg by mouth every morning.    Yes Historical Provider, MD  docusate sodium 100 MG CAPS Take 100 mg by mouth 2 (two) times daily. 03/02/13   Genelle Gather Babish, PA-C  HYDROcodone-acetaminophen (NORCO) 7.5-325 MG per tablet Take 1-2 tablets by mouth every 4 (four) hours as needed for pain. 03/01/13   Javier Docker, MD  methocarbamol (ROBAXIN) 500 MG tablet Take 1 tablet (500 mg total) by mouth 3 (three) times daily between meals as needed. 03/01/13   Javier Docker, MD    Diet: Regular diet Activity:WBAT Follow-up:in 10-14 days Disposition - Home Discharged Condition: good   Discharge Orders   Future Orders Complete By Expires   Call MD / Call 911  As directed    Comments:     If you experience chest pain or shortness of breath, CALL 911 and be transported to the hospital emergency room.  If you develope a fever above 101 F, pus (white drainage) or increased drainage or redness at the wound, or calf pain, call your surgeon's office.   Constipation Prevention  As directed    Comments:     Drink plenty of fluids.  Prune juice may be helpful.  You may use a stool softener, such as Colace (over the counter) 100 mg twice a day.  Use MiraLax (over the counter) for constipation as needed.   Diet - low sodium heart healthy  As directed    Discharge instructions  As  directed    Comments:     Maintain surgical dressing for 10-14 days, then replace with gauze and tape. Keep the area dry and clean until follow up. Follow up in 2 weeks at [facility]. Call with any questions or concerns.   Increase activity slowly as tolerated  As directed    Weight bearing as tolerated  As directed        Medication List    STOP taking these medications       naproxen sodium 220 MG tablet  Commonly known as:  ANAPROX      TAKE these medications       DSS 100 MG Caps  Take 100 mg by mouth 2 (two) times daily.     HYDROcodone-acetaminophen 7.5-325 MG per tablet  Commonly known as:  NORCO  Take 1-2 tablets by mouth every 4 (four) hours as needed for pain.     methocarbamol 500 MG tablet  Commonly known as:  ROBAXIN  Take 1 tablet (500 mg total) by mouth 3 (three) times daily between meals as needed.     sertraline 100 MG tablet  Commonly known as:  ZOLOFT  Take 200 mg by mouth every morning.           Follow-up Information   Follow up with BEANE,JEFFREY C, MD In 2 weeks.   Specialty:  Orthopedic Surgery   Contact information:   [address] [place] [address] [phone]       Signed: Dorothy Spark. 03/02/2013, 9:25 AM

## 2013-03-02 NOTE — Evaluation (Signed)
Occupational Therapy Evaluation Patient Details Name: [name] MRN: [id number] DOB: [birth year] Today's Date: 03/02/2013 Time: 4098-1191 OT Time Calculation (min): 13 min  OT Assessment / Plan / Recommendation History of present illness s/p repeat lumbar decompression L4-L5, foraminotomies L4-L5, microdiscectomy L4, L5 R   Clinical Impression   Independent in mobility, modified independent in ADL.  All education completed. No further OT needs.    OT Assessment  Patient does not need any further OT services    Follow Up Recommendations  No OT follow up    Barriers to Discharge      Equipment Recommendations  None recommended by OT    Recommendations for Other Services    Frequency       Precautions / Restrictions Precautions Precautions: Back Precaution Comments: Pt familiar with back precautions from previous surgery. Reviewed logroll technique as well.  Restrictions Weight Bearing Restrictions: No   Pertinent Vitals/Pain No pain reported    ADL  Eating/Feeding: Independent Where Assessed - Eating/Feeding: Chair Grooming: Wash/dry hands;Wash/dry face;Independent Where Assessed - Grooming: Unsupported standing Upper Body Bathing: Independent Where Assessed - Upper Body Bathing: Unsupported standing Lower Body Bathing: Modified independent Where Assessed - Lower Body Bathing: Unsupported standing Upper Body Dressing: Independent Where Assessed - Upper Body Dressing: Unsupported sitting Lower Body Dressing: Modified independent Where Assessed - Lower Body Dressing: Unsupported sitting;Supported sit to stand Toilet Transfer: Modified independent Toilet Transfer Method: Sit to Barista: Regular height toilet Toileting - Clothing Manipulation and Hygiene: Independent Where Assessed - Toileting Clothing Manipulation and Hygiene: Sit to stand from 3-in-1 or toilet Transfers/Ambulation Related to ADLs: independent ADL Comments: Pt able  to cross his foot over opposite knee to donn socks.  Instructed in use of long bath sponge, getting in and out of car, and picking items up from floor maintaining back precautions.    OT Diagnosis:    OT Problem List:   OT Treatment Interventions:     OT Goals(Current goals can be found in the care plan section) Acute Rehab OT Goals Patient Stated Goal: home  Visit Information  Last OT Received On: 03/02/13 Assistance Needed: +1 PT/OT Co-Evaluation/Treatment: Yes History of Present Illness: s/p repeat lumbar decompression L4-L5, foraminotomies L4-L5, microdiscectomy L4, L5 R       Prior Functioning     Home Living Family/patient expects to be discharged to:: Private residence Living Arrangements: Spouse/significant other Available Help at Discharge: Family Type of Home: House Home Access: Stairs to enter Secretary/administrator of Steps: 3 Entrance Stairs-Rails: Right Home Layout: One level Home Equipment: None Prior Function Level of Independence: Independent Communication Communication: No difficulties Dominant Hand: Right         Vision/Perception Vision - History Baseline Vision: Wears glasses all the time Patient Visual Report: No change from baseline   Cognition  Cognition Arousal/Alertness: Awake/alert Behavior During Therapy: WFL for tasks assessed/performed Overall Cognitive Status: Within Functional Limits for tasks assessed    Extremity/Trunk Assessment Upper Extremity Assessment Upper Extremity Assessment: Overall WFL for tasks assessed Lower Extremity Assessment Lower Extremity Assessment: Defer to PT evaluation Cervical / Trunk Assessment Cervical / Trunk Assessment: Normal     Mobility Bed Mobility Bed Mobility: Rolling Right;Right Sidelying to Sit Rolling Right: 7: Independent Right Sidelying to Sit: 7: Independent Transfers Sit to Stand: 7: Independent Stand to Sit: 7: Independent     Exercise     Balance     End of Session OT -  End of Session Patient left: with call  bell/phone within reach;with family/visitor present  GO Functional Assessment Tool Used: clinical judgment Functional Limitation: Self care Self Care Current Status (Z6109): 0 percent impaired, limited or restricted Self Care Goal Status (U0454): 0 percent impaired, limited or restricted Self Care Discharge Status [id number]): 0 percent impaired, limited or restricted   Evern Bio 03/02/2013, 10:41 AM [phone]

## 2013-03-02 NOTE — Op Note (Signed)
[name], [name]         ACCOUNT NO.:  [id number]  MEDICAL RECORD NO.:  [id number]  LOCATION:  1613                         FACILITY:  [facility]  PHYSICIAN:  Jene Every, M.D.    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  POSTOPERATIVE DIAGNOSIS:  Recurrent disk herniation, spinal stenosis, L4- 5, right.  PROCEDURE PERFORMED: 1. Redo lumbar decompression, L4-5 right. 2. Foraminotomies at L4 and L5. 3. Microdiskectomy, L4-5, right.  ANESTHESIA:  General.  ASSISTANT:  Lanna Poche, PA was helped throughout the case for general intermittent neural traction, retraction, helped with patient positioning, suturing.  BRIEF HISTORY:  A 42 year old, recurrent disk herniation, refractory L4- L5 radicular pain, disk herniation epidural fibrosis, mild femoral weakness, dermatomal dysesthesias, despite rest, activity modification, exercise program.  Prior narcotic analgesics to the present neurologic deficit.  We will proceed with the disk herniation, disk removal, and foraminotomies.  Risks and benefits were discussed including bleeding, infection, damage to neurovascular structures, DVT, PE, anesthetic complications, need for fusion in the future, etc.  TECHNIQUE:  With the patient in supine position, after induction of adequate general anesthesia, 2 g Kefzol, 900 clinda, placed prone on the [place] frame.  All bony prominences were well padded.  Lumbar region was prepped and draped in usual sterile fashion.  Previous surgical incision was utilized to excise the scar.  Subcutaneous tissue was dissected.  Electrocautery was utilized to achieve hemostasis.  Fascia lata were divided in line with skin incision.  Paraspinous muscle elevated from lamina 4-5.  McCullough retractors were placed.   Operating microscope was draped, brought into the surgical field after confirmatory radiograph obtained.  A curette was utilized to skeletonize previous laminotomy.  A large hemilaminotomy of the caudad edge of 4. We enlarged the foraminotomy of 5 after mobilizing the epidural fibrosis and protecting the 5 root.  We found the 5 root and traced it proximally to the medial border of the pedicle.  This was freed up from the epidural fibrosis.  We then performed a generous foraminotomy of 4 in particular the superior articulating process of 5.  The root of L4 was identified.  The axilla root was identified as well and the medial border of the pedicle and the lateral recess at 4-5.  Following this, addressed off the medial border of the pedicle and waited gently mobilizing the 4 root cephalad on the fragments of disk material, inferior adjacent of space.  This was removed with a micropituitary. Then, we entered the disk space and further mobilized it meticulously with a small abstain nerve function Penfield 4.  Multiple passes of manipulations were retrieved 4-5 small fragments from just beneath the 4 root and thecal sac.  I went through the foramen and into the posterolateral aspect of the disk.  This was just beneath thecal sac consistent with that seen on the MRI.  Following multiple passes retrievals of the small fragments, there was no residual disk herniation noted and there was satisfactory decompression at 4 and 5 roots with good excursion of both roots 1 cm medial to pedicle without  tension. Then, placed a Woodson retractor probe down the foramen of 5, out the foramen of 4, and cephalad to the pedicle 4 with good decompression noted.  No CSF leakage or active bleeding.  Copiously irrigated disk space with antibiotic irrigation.  Inspection revealed no CSF leakage or active bleeding.  Next, thrombin-soaked Gelfoam sponge was placed in the laminotomy defect.  Removed the [facility]  retractor.  Copiously irrigated the wound.  We closed the fascia with 1 Vicryl.  Subcu tissue, skin Prolene, skin reinforced, sterile dressing applied.  Placed supine on the hospital bed, extubated without difficulty, and transported to the recovery room in satisfactory condition.  The patient tolerated the procedure well.  No complications.  No blood loss.     Jene Every, M.D.     Cordelia Pen  D:  03/01/2013  T:  03/02/2013  Job:  [id number]

## 2013-03-04 ENCOUNTER — Encounter (HOSPITAL_COMMUNITY): Payer: Self-pay | Admitting: Specialist

## 2013-11-07 ENCOUNTER — Encounter (HOSPITAL_COMMUNITY): Payer: Self-pay | Admitting: Specialist

## 2014-05-13 IMAGING — CR DG LUMBAR SPINE 2-3V
2 series · 2 of 2 positions shown · non-contrast
Comparison: 10/20/2010.

CLINICAL DATA: Preoperative evaluation prior lumbar laminectomy.

LUMBAR SPINE - 2-3 VIEW

[t l-spine a.p.]
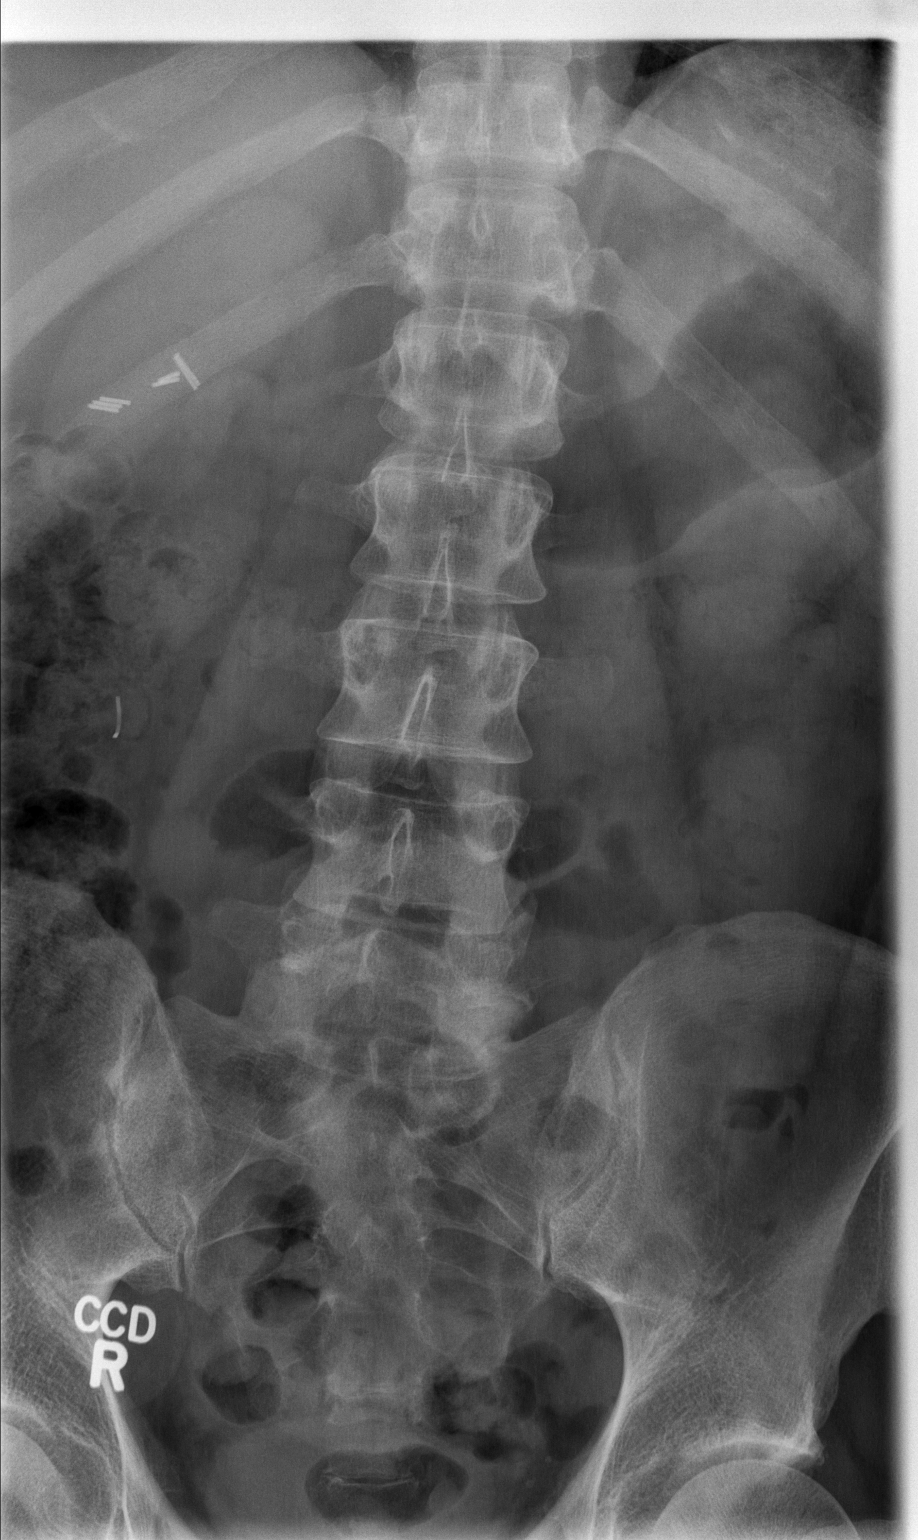

[t l-spine lat]
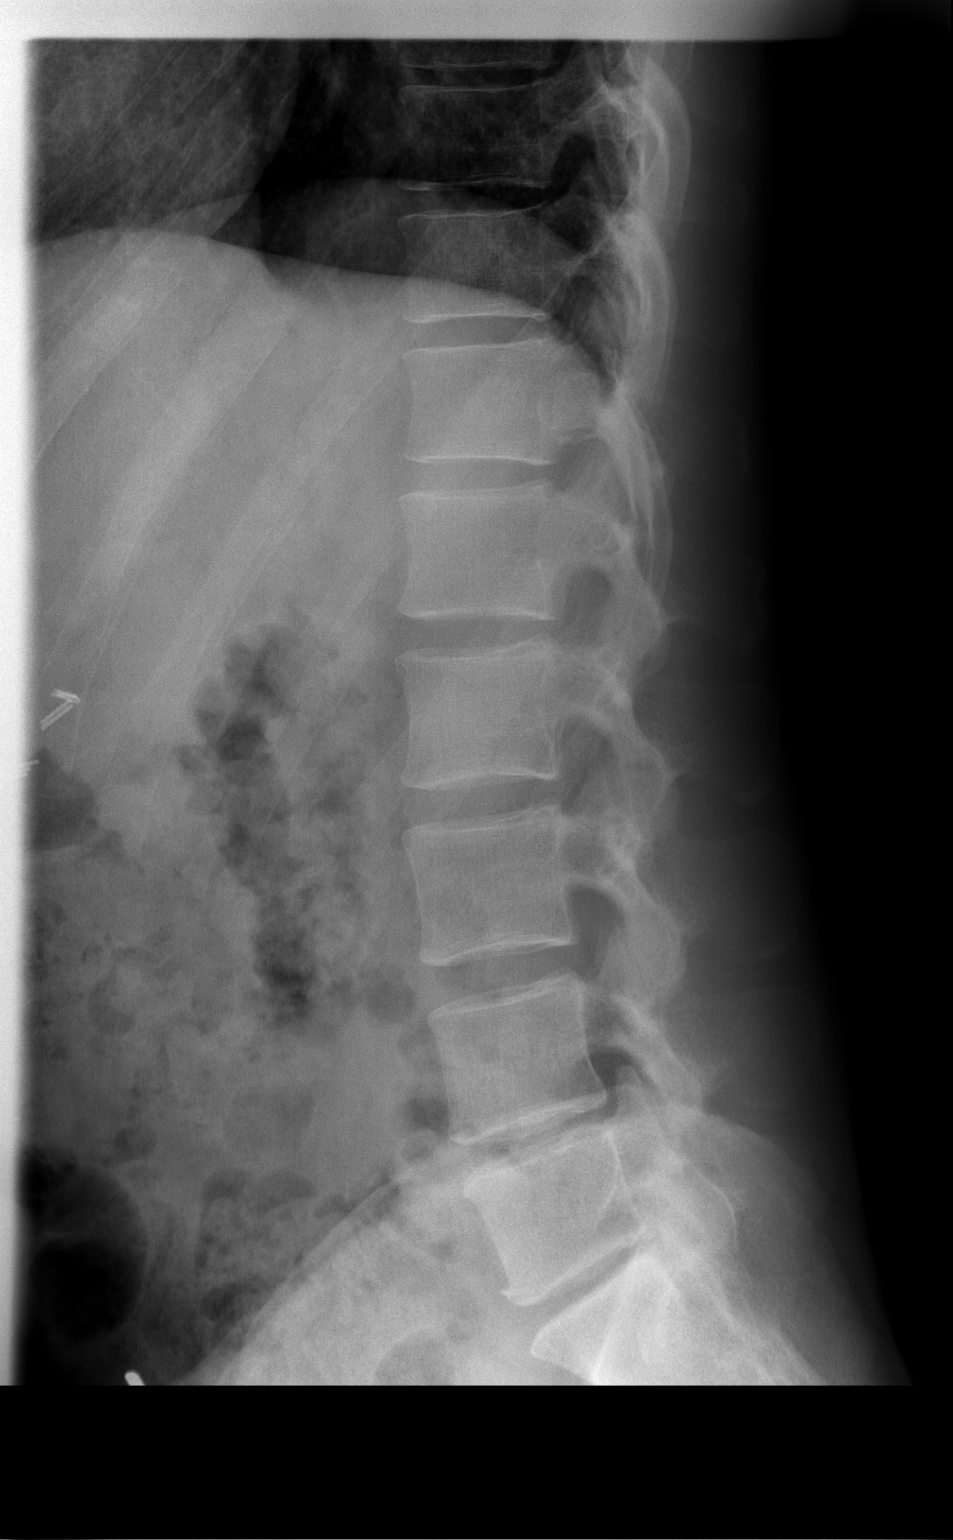

[2 of 2 positions shown; findings below may reference images not displayed]

FINDINGS: AP and lateral views of the lumbar spine demonstrate no
acute displaced fractures or compression type fractures.  Alignment
is anatomic.  Mild multilevel degenerative disc disease is noted,
most severe at L4-L5 and L5-S1.  Multilevel facet arthropathy is
also noted and most severe at L5-S1.
IMPRESSION: 1.  Mild multilevel degenerative disc disease and lumbar
spondylosis, as above.

## 2015-11-07 ENCOUNTER — Emergency Department (HOSPITAL_COMMUNITY)
Admission: EM | Admit: 2015-11-07 | Discharge: 2015-11-07 | Disposition: A | Discharge status: Home / Self Care | Payer: Federal, State, Local not specified - PPO | Attending: Emergency Medicine | Admitting: Emergency Medicine

## 2015-11-07 ENCOUNTER — Encounter (HOSPITAL_COMMUNITY): Payer: Self-pay | Admitting: Emergency Medicine

## 2015-11-07 DIAGNOSIS — M545 Low back pain: Secondary | ICD-10-CM | POA: Diagnosis not present

## 2015-11-07 DIAGNOSIS — M5430 Sciatica, unspecified side: Secondary | ICD-10-CM | POA: Diagnosis present

## 2015-11-07 NOTE — ED Notes (Signed)
Pt c/o R lower back pain radiating to R leg, pt has seen ortho and another ED, pain is worse at this time.

## 2022-05-18 ENCOUNTER — Encounter: Payer: Self-pay | Admitting: Emergency Medicine

## 2022-05-18 ENCOUNTER — Ambulatory Visit
Admission: EM | Admit: 2022-05-18 | Discharge: 2022-05-18 | Disposition: A | Discharge status: Home / Self Care | Payer: Federal, State, Local not specified - PPO | Attending: Family Medicine | Admitting: Family Medicine

## 2022-05-18 DIAGNOSIS — J309 Allergic rhinitis, unspecified: Secondary | ICD-10-CM

## 2022-05-18 DIAGNOSIS — R059 Cough, unspecified: Secondary | ICD-10-CM

## 2022-05-18 DIAGNOSIS — J0100 Acute maxillary sinusitis, unspecified: Secondary | ICD-10-CM | POA: Diagnosis not present

## 2022-05-18 MED ORDER — AZITHROMYCIN 250 MG PO TABS: 250.0000 mg | ORAL_TABLET | Freq: Every day | ORAL | 0 refills | Status: AC

## 2022-05-18 MED ORDER — FEXOFENADINE HCL 180 MG PO TABS: 180.0000 mg | ORAL_TABLET | Freq: Every day | ORAL | 0 refills | Status: AC

## 2022-05-18 MED ORDER — PREDNISONE 20 MG PO TABS: ORAL_TABLET | ORAL | 0 refills | Status: AC

## 2022-05-18 NOTE — ED Provider Notes (Signed)
[name]    CSN: [id number] Arrival date & time: 05/18/22  0801      History   Chief Complaint Chief Complaint  Patient presents with   Facial Pain    HPI [name] is a 51 y.o. male.   HPI [name] 51 year old male presents with sinus nasal congestion for 2 weeks.  Reports using OTC NyQuil and Mucinex as needed.  Patient reports wife was sick last week and was prescribed a Z-Pak which cleared up her symptoms.  PMH significant for depression, GERD, history of hiatal hernia.  Past Medical History:  Diagnosis Date   Depression    GERD (gastroesophageal reflux disease)    H/O hiatal hernia    HNP (herniated nucleus pulposus), lumbar     Patient Active Problem List   Diagnosis Date Noted   HNP (herniated nucleus pulposus), lumbar 09/27/2012    Past Surgical History:  Procedure Laterality Date   BACK SURGERY N/A    lower   CHOLECYSTECTOMY  1996   LUMBAR LAMINECTOMY/DECOMPRESSION MICRODISCECTOMY N/A 09/27/2012   Procedure: REDO LUMBAR LAMINECTOMY/DECOMPRESSION MICRODISCECTOMY 1 LEVEL;  Surgeon: Javier Docker, MD;  Location: [facility];  Service: Orthopedics;  Laterality: N/A;   LUMBAR LAMINECTOMY/DECOMPRESSION MICRODISCECTOMY Right 03/01/2013   Procedure: REDO MICRO LUMBAR DECOMPRESSION  L4-L5 ON RIGHT;  Surgeon: Javier Docker, MD;  Location: [facility];  Service: Orthopedics;  Laterality: Right;   right rotator cuff repair  2004       Home Medications    Prior to Admission medications   Medication Sig Start Date End Date Taking? Authorizing Provider  azithromycin (ZITHROMAX) 250 MG tablet Take 1 tablet (250 mg total) by mouth daily. Take first 2 tablets together, then 1 every day until finished. 05/18/22  Yes Trevor Iha, FNP  fexofenadine [facility] ALLERGY) 180 MG tablet Take 1 tablet (180 mg total) by mouth daily for 15 days. 05/18/22 06/02/22 Yes Trevor Iha, FNP  predniSONE (DELTASONE) 20 MG tablet Take 3 tabs PO daily x 5 days. 05/18/22  Yes  Trevor Iha, FNP  sertraline (ZOLOFT) 100 MG tablet Take 200 mg by mouth every morning.     [provider]    Family History Family History  Problem Relation Age of Onset   Healthy Mother     Social History Social History   Tobacco Use   Smoking status: Never   Smokeless tobacco: Never  Vaping Use   Vaping Use: Never used  Substance Use Topics   Alcohol use: Yes    Comment: occasional   Drug use: No     Allergies   Patient has no known allergies.   Review of Systems Review of Systems  HENT:  Positive for congestion.   All other systems reviewed and are negative.    Physical Exam Triage Vital Signs ED Triage Vitals  Enc Vitals Group     BP 05/18/22 0814 127/89     Pulse Rate 05/18/22 0814 85     Resp 05/18/22 0814 14     Temp 05/18/22 0814 98.9 F (37.2 C)     Temp Source 05/18/22 0814 Oral     SpO2 05/18/22 0814 98 %     Weight 05/18/22 0817 189 lb (85.7 kg)     Height 05/18/22 0817 6' (1.829 m)     Head Circumference --      Peak Flow --      Pain Score 05/18/22 0816 0     Pain Loc --  Pain Edu? --      Excl. in GC? --    No data found.  Updated Vital Signs BP 127/89   Pulse 85   Temp 98.9 F (37.2 C) (Oral)   Resp 14   Ht 6' (1.829 m)   Wt 189 lb (85.7 kg)   SpO2 98%   BMI 25.63 kg/m   Physical Exam Vitals and nursing note reviewed.  Constitutional:      General: He is not in acute distress.    Appearance: Normal appearance. He is normal weight. He is not ill-appearing.  HENT:     Head: Normocephalic and atraumatic.     Right Ear: Tympanic membrane and external ear normal.     Left Ear: Tympanic membrane and external ear normal.     Ears:     Comments: Moderate eustachian tube dysfunction noted bilaterally    Mouth/Throat:     Mouth: Mucous membranes are moist.     Pharynx: Oropharynx is clear.     Comments: Significant amount of clear drainage of posterior oropharynx noted Eyes:     Extraocular Movements:  Extraocular movements intact.     Conjunctiva/sclera: Conjunctivae normal.     Pupils: Pupils are equal, round, and reactive to light.  Cardiovascular:     Rate and Rhythm: Normal rate and regular rhythm.     Pulses: Normal pulses.     Heart sounds: Normal heart sounds.  Pulmonary:     Effort: Pulmonary effort is normal.     Breath sounds: Normal breath sounds. No wheezing, rhonchi or rales.     Comments: Infrequent nonproductive cough noted on exam Musculoskeletal:        General: Normal range of motion.     Cervical back: Normal range of motion and neck supple.  Skin:    General: Skin is warm and dry.  Neurological:     General: No focal deficit present.     Mental Status: He is alert and oriented to person, place, and time.      UC Treatments / Results  Labs (all labs ordered are listed, but only abnormal results are displayed) Labs Reviewed - No data to display  EKG   Radiology No results found.  Procedures Procedures (including critical care time)  Medications Ordered in UC Medications - No data to display  Initial Impression / Assessment and Plan / UC Course  I have reviewed the triage vital signs and the nursing notes.  Pertinent labs & imaging results that were available during my care of the patient were reviewed by me and considered in my medical decision making (see chart for details).     MDM: 1.  Subacute maxillary sinusitis-Rx'd Zithromax; 2.  Cough-Rx prednisone; 3.  Allergic rhinitis-Rx'd Allegra. Directed patient to take medication as directed with food to completion.  Advised patient to take prednisone and Allegra with Zithromax daily for the next 5 days.  Advised may use Allegra as needed afterwards for concurrent postnasal drip/drainage.  Encouraged patient increase daily water intake to 64 ounces per day while taking these medications.  Advised patient if symptoms worsen and/or unresolved please follow-up with PCP or here for further evaluation.   Discharged home, hemodynamically stable.  Final Clinical Impressions(s) / UC Diagnoses   Final diagnoses:  Subacute maxillary sinusitis  Allergic rhinitis, unspecified seasonality, unspecified trigger  Cough, unspecified type     Discharge Instructions      Directed patient to take medication as directed with food to completion.  Advised  patient to take prednisone and Allegra with Zithromax daily for the next 5 days.  Advised may use Allegra as needed afterwards for concurrent postnasal drip/drainage.  Encouraged patient increase daily water intake to 64 ounces per day while taking these medications.  Advised patient if symptoms worsen and/or unresolved please follow-up with PCP or here for further evaluation.     ED Prescriptions     Medication Sig Dispense Auth. Provider   azithromycin (ZITHROMAX) 250 MG tablet Take 1 tablet (250 mg total) by mouth daily. Take first 2 tablets together, then 1 every day until finished. 6 tablet Trevor Iha, FNP   predniSONE (DELTASONE) 20 MG tablet Take 3 tabs PO daily x 5 days. 15 tablet Trevor Iha, FNP   fexofenadine [facility] ALLERGY) 180 MG tablet Take 1 tablet (180 mg total) by mouth daily for 15 days. 15 tablet Trevor Iha, FNP      PDMP not reviewed this encounter.   Trevor Iha, FNP 05/18/22 [phone]

## 2022-05-18 NOTE — Discharge Instructions (Addendum)
Directed patient to take medication as directed with food to completion.  Advised patient to take prednisone and Allegra with Zithromax daily for the next 5 days.  Advised may use Allegra as needed afterwards for concurrent postnasal drip/drainage.  Encouraged patient increase daily water intake to 64 ounces per day while taking these medications.  Advised patient if symptoms worsen and/or unresolved please follow-up with PCP or here for further evaluation.

## 2022-05-18 NOTE — ED Triage Notes (Signed)
Sinus congestion x 2 week  Nyquil for a few nights  Mucinex helped last night  Dry cough started yesterday  Denies fever  No home  COVID tests  Wife was sick last week - z-pak cleared it up

## 2022-05-19 ENCOUNTER — Telehealth: Payer: Self-pay

## 2022-05-19 NOTE — Telephone Encounter (Signed)
Called pt to check on status since UC visit. Wife answered and pt was unavailable. Advised that he can call back If any questions or concerns.
